# Patient Record
Sex: Female | Born: 1950 | Race: White | Hispanic: No | Smoking: Former smoker
Health system: Southern US, Community
[De-identification: ages and names within clinical notes are randomized; demographics above are authoritative.]

## PROBLEM LIST (undated history)

## (undated) DIAGNOSIS — I10 Essential (primary) hypertension: Secondary | ICD-10-CM

## (undated) DIAGNOSIS — K269 Duodenal ulcer, unspecified as acute or chronic, without hemorrhage or perforation: Secondary | ICD-10-CM

## (undated) DIAGNOSIS — C801 Malignant (primary) neoplasm, unspecified: Secondary | ICD-10-CM

## (undated) DIAGNOSIS — H269 Unspecified cataract: Secondary | ICD-10-CM

## (undated) DIAGNOSIS — J45909 Unspecified asthma, uncomplicated: Secondary | ICD-10-CM

## (undated) DIAGNOSIS — C4491 Basal cell carcinoma of skin, unspecified: Secondary | ICD-10-CM

## (undated) DIAGNOSIS — M1990 Unspecified osteoarthritis, unspecified site: Secondary | ICD-10-CM

## (undated) DIAGNOSIS — Z923 Personal history of irradiation: Secondary | ICD-10-CM

## (undated) DIAGNOSIS — K760 Fatty (change of) liver, not elsewhere classified: Secondary | ICD-10-CM

## (undated) DIAGNOSIS — A6009 Herpesviral infection of other urogenital tract: Secondary | ICD-10-CM

## (undated) DIAGNOSIS — D126 Benign neoplasm of colon, unspecified: Secondary | ICD-10-CM

## (undated) DIAGNOSIS — K5790 Diverticulosis of intestine, part unspecified, without perforation or abscess without bleeding: Secondary | ICD-10-CM

## (undated) DIAGNOSIS — T7840XA Allergy, unspecified, initial encounter: Secondary | ICD-10-CM

## (undated) DIAGNOSIS — Z8619 Personal history of other infectious and parasitic diseases: Secondary | ICD-10-CM

## (undated) DIAGNOSIS — G43909 Migraine, unspecified, not intractable, without status migrainosus: Secondary | ICD-10-CM

## (undated) DIAGNOSIS — E162 Hypoglycemia, unspecified: Secondary | ICD-10-CM

## (undated) DIAGNOSIS — J302 Other seasonal allergic rhinitis: Secondary | ICD-10-CM

## (undated) HISTORY — DX: Herpesviral infection of other urogenital tract: A60.09

## (undated) HISTORY — DX: Other seasonal allergic rhinitis: J30.2

## (undated) HISTORY — PX: COSMETIC SURGERY: SHX468

## (undated) HISTORY — DX: Personal history of other infectious and parasitic diseases: Z86.19

## (undated) HISTORY — DX: Unspecified asthma, uncomplicated: J45.909

## (undated) HISTORY — DX: Migraine, unspecified, not intractable, without status migrainosus: G43.909

## (undated) HISTORY — PX: ROOT CANAL: SHX2363

## (undated) HISTORY — DX: Hypoglycemia, unspecified: E16.2

## (undated) HISTORY — DX: Unspecified osteoarthritis, unspecified site: M19.90

## (undated) HISTORY — PX: POLYPECTOMY: SHX149

## (undated) HISTORY — PX: COLONOSCOPY: SHX174

## (undated) HISTORY — DX: Essential (primary) hypertension: I10

## (undated) HISTORY — DX: Unspecified cataract: H26.9

## (undated) HISTORY — DX: Fatty (change of) liver, not elsewhere classified: K76.0

## (undated) HISTORY — DX: Duodenal ulcer, unspecified as acute or chronic, without hemorrhage or perforation: K26.9

## (undated) HISTORY — DX: Malignant (primary) neoplasm, unspecified: C80.1

## (undated) HISTORY — DX: Basal cell carcinoma of skin, unspecified: C44.91

## (undated) HISTORY — PX: TOOTH EXTRACTION: SUR596

## (undated) HISTORY — PX: CATARACT EXTRACTION W/ INTRAOCULAR LENS  IMPLANT, BILATERAL: SHX1307

## (undated) HISTORY — DX: Allergy, unspecified, initial encounter: T78.40XA

---
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
## 1976-02-01 HISTORY — PX: TONSILLECTOMY: SUR1361

## 1999-02-01 HISTORY — PX: LIPOSUCTION: SHX10

## 2011-11-28 ENCOUNTER — Other Ambulatory Visit (HOSPITAL_COMMUNITY): Payer: Self-pay | Admitting: Internal Medicine

## 2011-11-28 DIAGNOSIS — Z1231 Encounter for screening mammogram for malignant neoplasm of breast: Secondary | ICD-10-CM

## 2011-12-13 ENCOUNTER — Ambulatory Visit (HOSPITAL_COMMUNITY)
Admission: RE | Admit: 2011-12-13 | Discharge: 2011-12-13 | Disposition: A | Discharge status: Home / Self Care | Payer: Self-pay | Source: Ambulatory Visit | Attending: Internal Medicine | Admitting: Internal Medicine

## 2011-12-13 DIAGNOSIS — Z1231 Encounter for screening mammogram for malignant neoplasm of breast: Secondary | ICD-10-CM

## 2013-11-20 ENCOUNTER — Ambulatory Visit (HOSPITAL_BASED_OUTPATIENT_CLINIC_OR_DEPARTMENT_OTHER)
Admission: RE | Admit: 2013-11-20 | Discharge: 2013-11-20 | Disposition: A | Discharge status: Home / Self Care | Payer: BC Managed Care – PPO | Source: Ambulatory Visit | Attending: Physician Assistant | Admitting: Physician Assistant

## 2013-11-20 ENCOUNTER — Ambulatory Visit (INDEPENDENT_AMBULATORY_CARE_PROVIDER_SITE_OTHER): Payer: BC Managed Care – PPO | Admitting: Physician Assistant

## 2013-11-20 ENCOUNTER — Ambulatory Visit (HOSPITAL_COMMUNITY): Payer: BC Managed Care – PPO

## 2013-11-20 ENCOUNTER — Encounter: Payer: Self-pay | Admitting: Physician Assistant

## 2013-11-20 VITALS — BP 102/40 | HR 78 | Temp 98.3°F | Resp 16 | Ht 63.5 in | Wt 210.0 lb

## 2013-11-20 DIAGNOSIS — Z8041 Family history of malignant neoplasm of ovary: Secondary | ICD-10-CM

## 2013-11-20 DIAGNOSIS — M2560 Stiffness of unspecified joint, not elsewhere classified: Secondary | ICD-10-CM

## 2013-11-20 DIAGNOSIS — J309 Allergic rhinitis, unspecified: Secondary | ICD-10-CM

## 2013-11-20 DIAGNOSIS — Z23 Encounter for immunization: Secondary | ICD-10-CM

## 2013-11-20 DIAGNOSIS — I10 Essential (primary) hypertension: Secondary | ICD-10-CM

## 2013-11-20 DIAGNOSIS — K219 Gastro-esophageal reflux disease without esophagitis: Secondary | ICD-10-CM

## 2013-11-20 DIAGNOSIS — R102 Pelvic and perineal pain: Secondary | ICD-10-CM

## 2013-11-20 NOTE — Patient Instructions (Addendum)
Please stop by lab for blood work. I will call you with your results. For now hold off on the hydrochlorothiazide.  Continue the other medications as directed.    Follow-up will be based on results.   It was a pleasure meeting you today!  Welcome to [facility]!

## 2013-11-20 NOTE — Progress Notes (Signed)
Patient presents to clinic today to establish care.  Acute Concerns: (1) Patient c/o of joint pain and inflammation in knees, neck and back over the past few years.  Endorses symptoms worsen with alcohol or red meat consumption.  Denies known history of gout. Is currently on HCTZ. Denies recent trauma.  Endorses prior history of osteoarthritis. Denies family rheumatoid arthritis. Endorses stiffness lasting more than 1 hour.  (2) Patient c/o dull and achy pain in left side and LLQ that has been present constantly for 1 year.  Denies fever, chills.  Denies change to appetite.  Endorses some constipation.  Denies blood in stool or tenesmus.  Denies nausea or vomiting.  Is overdue for colonoscopy.  Endorses acid reflux symptoms, occasionally.  Denies epigastric pain or dysphagia.  Chronic Issues: Hypertension -- Patient currently on combination of  Atenolol 25 mg QD, Lotensin 20 mg QD and HCTZ 12.5 mg QD.  BP 102/94.    Allergic Rhinitis -- seasonal.  Well-controlled at present with Flonase.  Health Maintenance: Dental -- Overdue. Vision -- Overdue.  Immunizations -- Due for influenza and TDaP.  Will be getting today. Colonoscopy -- Never had due to finances.  Will need referral to GI for screening colonoscopy.  Mammogram -- Endorses last mammogram in 2014.  Denies abnormal findings.  Past Medical History  Diagnosis Date  . Frequent headaches   . Migraine   . Duodenal ulcer   . Hay fever   . Seasonal allergies   . Environmental allergies   . Hypertension   . Arthritis   . History of chicken pox     Past Surgical History  Procedure Laterality Date  . Tonsillectomy  1978  . Tooth extraction    . Root canal      No current outpatient prescriptions on file prior to visit.   No current facility-administered medications on file prior to visit.    Allergies  Allergen Reactions  . Codeine Nausea And Vomiting  . Other Nausea And Vomiting    MSG    Family History  Problem  Relation Age of Onset  . Breast cancer Mother 109    Deceased  . Ovarian cancer Sister   . Stroke Mother   . Hypertension Mother   . Renal cancer Father   . Lung cancer Father 42    Deceased  . Heart disease Maternal Grandmother   . Stomach cancer Paternal Grandfather   . Cancer - Other Maternal Aunt     Pancreatic  . Diabetes Brother   . Heart disease Maternal Aunt   . Heart attack Maternal Aunt   . Melanoma Paternal Aunt   . Multiple myeloma Paternal Aunt   . Brain cancer Brother     G-Blastoma  . Melanoma Father   . Lung cancer Brother   . Lung cancer Maternal Aunt     History   Social History  . Marital Status: Single    Spouse Name: N/A    Number of Children: N/A  . Years of Education: N/A   Occupational History  . Not on file.   Social History Main Topics  . Smoking status: Former Research scientist (life sciences)  . Smokeless tobacco: Never Used     Comment: Quit >30 yrs  . Alcohol Use: Yes     Comment: social  . Drug Use: No  . Sexual Activity: Not on file   Other Topics Concern  . Not on file   Social History Narrative  . No narrative on file  ROS See HPI.  All other ROS are negative.  BP 102/40  Pulse 78  Temp(Src) 98.3 F (36.8 C) (Oral)  Resp 16  Ht 5' 3.5" (1.613 m)  Wt 210 lb (95.255 kg)  BMI 36.61 kg/m2  SpO2 97%  Physical Exam  Constitutional: She is oriented to person, place, and time and well-developed, well-nourished, and in no distress.  HENT:  Head: Normocephalic and atraumatic.  Right Ear: External ear normal.  Left Ear: External ear normal.  Nose: Nose normal.  Mouth/Throat: Oropharynx is clear and moist. No oropharyngeal exudate.  TM within normal limits bilaterally.  Eyes: Conjunctivae are normal. Pupils are equal, round, and reactive to light.  Neck: Neck supple. No thyromegaly present.  Cardiovascular: Normal rate, regular rhythm, normal heart sounds and intact distal pulses.   Pulmonary/Chest: Effort normal and breath sounds normal. No  respiratory distress. She has no wheezes. She has no rales. She exhibits no tenderness.  Abdominal: Soft. Bowel sounds are normal. She exhibits no distension and no mass. There is no tenderness. There is no rebound and no guarding.  Lymphadenopathy:    She has no cervical adenopathy.  Neurological: She is alert and oriented to person, place, and time.  Skin: Skin is warm and dry. No rash noted.  Psychiatric: Affect normal.   Assessment/Plan: Need for prophylactic vaccination and inoculation against influenza Immunization given by nursing staff.  Need for prophylactic vaccination with combined diphtheria-tetanus-pertussis (DTP) vaccine Immunization given by nursing staff.  Essential hypertension Medications refilled.  Take as directed.  DASH handout given.  Joint stiffness Will obtain CBC, ESR, RF and Uric Acid level.  Pelvic pain in female + Family history of Ovarian Cancer.  Will obtain pelvic and Transvaginal US.  CBC, BMP, UA and culture will also be obtained.

## 2013-11-20 NOTE — Progress Notes (Signed)
Pre visit review using our clinic review tool, if applicable. No additional management support is needed unless otherwise documented below in the visit note/SLS  

## 2013-11-21 LAB — URINALYSIS, ROUTINE W REFLEX MICROSCOPIC
Bilirubin Urine: NEGATIVE
Hgb urine dipstick: NEGATIVE
KETONES UR: NEGATIVE
Leukocytes, UA: NEGATIVE
Nitrite: NEGATIVE
PH: 6 (ref 5.0–8.0)
Specific Gravity, Urine: 1.025 (ref 1.000–1.030)
TOTAL PROTEIN, URINE-UPE24: NEGATIVE
URINE GLUCOSE: NEGATIVE
Urobilinogen, UA: 0.2 (ref 0.0–1.0)

## 2013-11-21 LAB — RHEUMATOID FACTOR: Rheumatoid fact SerPl-aCnc: <10 [IU]/mL

## 2013-11-21 LAB — BASIC METABOLIC PANEL
BUN: 17 mg/dL (ref 6–23)
CO2: 28 mEq/L (ref 19–32)
Calcium: 9.4 mg/dL (ref 8.4–10.5)
Chloride: 102 mEq/L (ref 96–112)
Creatinine, Ser: 0.9 mg/dL (ref 0.4–1.2)
GFR: 66.54 mL/min (ref >60.00)
GLUCOSE: 116 mg/dL — AB (ref 70–99)
Potassium: 3.3 mEq/L — ABNORMAL LOW (ref 3.5–5.1)
Sodium: 138 mEq/L (ref 135–145)

## 2013-11-21 LAB — CBC
HCT: 39.7 % (ref 36.0–46.0)
Hemoglobin: 13.3 g/dL (ref 12.0–15.0)
MCHC: 33.6 g/dL (ref 30.0–36.0)
MCV: 93.1 fl (ref 78.0–100.0)
PLATELETS: 277 10*3/uL (ref 150.0–400.0)
RBC: 4.26 Mil/uL (ref 3.87–5.11)
RDW: 13.2 % (ref 11.5–15.5)
WBC: 7.3 10*3/uL (ref 4.0–10.5)

## 2013-11-21 LAB — URIC ACID: Uric Acid, Serum: 6.4 mg/dL (ref 2.4–7.0)

## 2013-11-21 LAB — SEDIMENTATION RATE: Sed Rate: 7 mm/hr (ref 0–22)

## 2013-11-21 LAB — H. PYLORI ANTIBODY, IGG: H Pylori IgG: NEGATIVE

## 2013-11-22 ENCOUNTER — Telehealth: Payer: Self-pay

## 2013-11-22 DIAGNOSIS — K219 Gastro-esophageal reflux disease without esophagitis: Secondary | ICD-10-CM | POA: Insufficient documentation

## 2013-11-22 DIAGNOSIS — R102 Pelvic and perineal pain unspecified side: Secondary | ICD-10-CM | POA: Insufficient documentation

## 2013-11-22 DIAGNOSIS — J309 Allergic rhinitis, unspecified: Secondary | ICD-10-CM | POA: Insufficient documentation

## 2013-11-22 DIAGNOSIS — Z8041 Family history of malignant neoplasm of ovary: Secondary | ICD-10-CM | POA: Insufficient documentation

## 2013-11-22 DIAGNOSIS — M2560 Stiffness of unspecified joint, not elsewhere classified: Secondary | ICD-10-CM | POA: Insufficient documentation

## 2013-11-22 DIAGNOSIS — I10 Essential (primary) hypertension: Secondary | ICD-10-CM | POA: Insufficient documentation

## 2013-11-22 DIAGNOSIS — Z23 Encounter for immunization: Secondary | ICD-10-CM

## 2013-11-22 NOTE — Telephone Encounter (Signed)
Pt notified and made aware.  Follow up appointment scheduled for Friday, November 13th at 1:15 pm with Elyn Aquas, PA-C.

## 2013-11-22 NOTE — Telephone Encounter (Signed)
Message copied by Rudene Anda on Fri Nov 22, 2013  3:59 PM ------      Message from: Raiford Noble      Created: Fri Nov 22, 2013 12:27 PM       Labs look great overall. Potassium mildly decreased but should improve now that we have stopped HCTZ.  Uric acid is normal as well as rheumatoid panel.  Do not think symptoms are gout related.  Still want her to hold off on HCTZ until I see her for follow-up in a few weeks. ------

## 2013-11-26 DIAGNOSIS — Z23 Encounter for immunization: Secondary | ICD-10-CM | POA: Insufficient documentation

## 2013-11-26 NOTE — Assessment & Plan Note (Signed)
Immunization given by nursing staff. 

## 2013-11-26 NOTE — Assessment & Plan Note (Signed)
Medications refilled.  Take as directed.  DASH handout given. 

## 2013-11-26 NOTE — Assessment & Plan Note (Signed)
+   Family history of Ovarian Cancer.  Will obtain pelvic and Transvaginal US.  CBC, BMP, UA and culture will also be obtained.

## 2013-11-26 NOTE — Assessment & Plan Note (Signed)
Will obtain CBC, ESR, RF and Uric Acid level. 

## 2013-12-13 ENCOUNTER — Encounter: Payer: Self-pay | Admitting: Physician Assistant

## 2013-12-13 ENCOUNTER — Ambulatory Visit (INDEPENDENT_AMBULATORY_CARE_PROVIDER_SITE_OTHER): Payer: BC Managed Care – PPO | Admitting: Physician Assistant

## 2013-12-13 VITALS — BP 107/86 | HR 66 | Temp 98.6°F | Resp 16 | Ht 63.5 in | Wt 207.5 lb

## 2013-12-13 DIAGNOSIS — M62838 Other muscle spasm: Secondary | ICD-10-CM | POA: Insufficient documentation

## 2013-12-13 DIAGNOSIS — H269 Unspecified cataract: Secondary | ICD-10-CM

## 2013-12-13 DIAGNOSIS — I10 Essential (primary) hypertension: Secondary | ICD-10-CM

## 2013-12-13 DIAGNOSIS — Z1211 Encounter for screening for malignant neoplasm of colon: Secondary | ICD-10-CM

## 2013-12-13 DIAGNOSIS — E876 Hypokalemia: Secondary | ICD-10-CM | POA: Insufficient documentation

## 2013-12-13 LAB — BASIC METABOLIC PANEL
BUN: 13 mg/dL (ref 6–23)
CO2: 28 mEq/L (ref 19–32)
CREATININE: 0.9 mg/dL (ref 0.4–1.2)
Calcium: 9.5 mg/dL (ref 8.4–10.5)
Chloride: 104 mEq/L (ref 96–112)
GFR: 67.38 mL/min (ref >60.00)
Glucose, Bld: 103 mg/dL — ABNORMAL HIGH (ref 70–99)
Potassium: 3.9 mEq/L (ref 3.5–5.1)
Sodium: 138 mEq/L (ref 135–145)

## 2013-12-13 MED ORDER — METHOCARBAMOL 500 MG PO TABS: 500.0000 mg | ORAL_TABLET | Freq: Three times a day (TID) | ORAL | Status: DC | PRN

## 2013-12-13 NOTE — Progress Notes (Signed)
Pre visit review using our clinic review tool, if applicable. No additional management support is needed unless otherwise documented below in the visit note/SLS  

## 2013-12-13 NOTE — Assessment & Plan Note (Signed)
Encouraged Glucosamine-Chondroitin supplementation.  Rx Robaxin for spasm. Rechecking BMP (Potassium) level today.  Follow-up in 1 month.

## 2013-12-13 NOTE — Patient Instructions (Signed)
We will stop the HCTZ for now.  Continue other BP medications.  Stay well hydrated.  Start a Glucosamine-Chondroitin supplementation to help with joints.  Take Robaxin as directed.  Follow-up in 1-2 months.  Hypertension Hypertension, commonly called high blood pressure, is when the force of blood pumping through your arteries is too strong. Your arteries are the blood vessels that carry blood from your heart throughout your body. A blood pressure reading consists of a higher number over a lower number, such as 110/72. The higher number (systolic) is the pressure inside your arteries when your heart pumps. The lower number (diastolic) is the pressure inside your arteries when your heart relaxes. Ideally you want your blood pressure below 120/80. Hypertension forces your heart to work harder to pump blood. Your arteries may become narrow or stiff. Having hypertension puts you at risk for heart disease, stroke, and other problems.  RISK FACTORS Some risk factors for high blood pressure are controllable. Others are not.  Risk factors you cannot control include:   Race. You may be at higher risk if you are African American.  Age. Risk increases with age.  Gender. Men are at higher risk than women before age 37 years. After age 24, women are at higher risk than men. Risk factors you can control include:  Not getting enough exercise or physical activity.  Being overweight.  Getting too much fat, sugar, calories, or salt in your diet.  Drinking too much alcohol. SIGNS AND SYMPTOMS Hypertension does not usually cause signs or symptoms. Extremely high blood pressure (hypertensive crisis) may cause headache, anxiety, shortness of breath, and nosebleed. DIAGNOSIS  To check if you have hypertension, your health care provider will measure your blood pressure while you are seated, with your arm held at the level of your heart. It should be measured at least twice using the same arm. Certain conditions  can cause a difference in blood pressure between your right and left arms. A blood pressure reading that is higher than normal on one occasion does not mean that you need treatment. If one blood pressure reading is high, ask your health care provider about having it checked again. TREATMENT  Treating high blood pressure includes making lifestyle changes and possibly taking medicine. Living a healthy lifestyle can help lower high blood pressure. You may need to change some of your habits. Lifestyle changes may include:  Following the DASH diet. This diet is high in fruits, vegetables, and whole grains. It is low in salt, red meat, and added sugars.  Getting at least 2 hours of brisk physical activity every week.  Losing weight if necessary.  Not smoking.  Limiting alcoholic beverages.  Learning ways to reduce stress. If lifestyle changes are not enough to get your blood pressure under control, your health care provider may prescribe medicine. You may need to take more than one. Work closely with your health care provider to understand the risks and benefits. HOME CARE INSTRUCTIONS  Have your blood pressure rechecked as directed by your health care provider.   Take medicines only as directed by your health care provider. Follow the directions carefully. Blood pressure medicines must be taken as prescribed. The medicine does not work as well when you skip doses. Skipping doses also puts you at risk for problems.   Do not smoke.   Monitor your blood pressure at home as directed by your health care provider. SEEK MEDICAL CARE IF:   You think you are having a reaction to medicines  taken.  You have recurrent headaches or feel dizzy.  You have swelling in your ankles.  You have trouble with your vision. SEEK IMMEDIATE MEDICAL CARE IF:  You develop a severe headache or confusion.  You have unusual weakness, numbness, or feel faint.  You have severe chest or abdominal  pain.  You vomit repeatedly.  You have trouble breathing. MAKE SURE YOU:   Understand these instructions.  Will watch your condition.  Will get help right away if you are not doing well or get worse. Document Released: 01/17/2005 Document Revised: 06/03/2013 Document Reviewed: 11/09/2012 [facility] Patient Information 2015 [place], [place]. This information is not intended to replace advice given to you by your health care provider. Make sure you discuss any questions you have with your health care provider.

## 2013-12-13 NOTE — Assessment & Plan Note (Signed)
BP looks good.  Continue DASH diet. Will remain off of the HCTZ.  Follow-up in 1-2 months for reassessment.

## 2013-12-13 NOTE — Assessment & Plan Note (Signed)
Referral placed to GI for screening colonoscopy 

## 2013-12-13 NOTE — Assessment & Plan Note (Signed)
Will recheck BMP today now that patient is off of diuretic.

## 2013-12-13 NOTE — Progress Notes (Signed)
Patient presents to clinic today for follow-up and BP recheck after holding her HCTZ.  BP 107/68 in clinic today. Patient endorses marked improvement in leg pain and spasms.  Patient denies chest pain, palpitations, lightheadedness, vision changes or frequent headaches.  Patient is requesting referral to Ophthalmology for evaluation of her cataracts.  Also ready to schedule her screening colonoscopy.  Past Medical History  Diagnosis Date  . Frequent headaches   . Migraine   . Duodenal ulcer   . Hay fever   . Seasonal allergies   . Environmental allergies   . Hypertension   . Arthritis   . History of chicken pox     Current Outpatient Prescriptions on File Prior to Visit  Medication Sig Dispense Refill  . albuterol (PROVENTIL HFA;VENTOLIN HFA) 108 (90 BASE) MCG/ACT inhaler Inhale 1-2 puffs into the lungs every 6 (six) hours as needed for wheezing or shortness of breath.    Marland Kitchen atenolol (TENORMIN) 25 MG tablet Take 25 mg by mouth daily.    . benazepril (LOTENSIN) 20 MG tablet Take 20 mg by mouth daily.    . fluticasone (FLONASE) 50 MCG/ACT nasal spray Place 2 sprays into both nostrils daily as needed for allergies or rhinitis.    Marland Kitchen ibuprofen (ADVIL,MOTRIN) 200 MG tablet Take 200 mg by mouth every 6 (six) hours as needed for headache or moderate pain.     No current facility-administered medications on file prior to visit.    Allergies  Allergen Reactions  . Codeine Nausea And Vomiting  . Other Diarrhea and Nausea And Vomiting    MSG    Family History  Problem Relation Age of Onset  . Breast cancer Mother 75    Deceased  . Ovarian cancer Sister   . Stroke Mother   . Hypertension Mother   . Renal cancer Father   . Lung cancer Father 30    Deceased  . Heart disease Maternal Grandmother   . Stomach cancer Paternal Grandfather   . Cancer - Other Maternal Aunt     Pancreatic  . Diabetes Brother   . Heart disease Maternal Aunt   . Heart attack Maternal Aunt   . Melanoma  Paternal Aunt   . Multiple myeloma Paternal Aunt   . Brain cancer Brother     G-Blastoma  . Melanoma Father   . Lung cancer Brother   . Lung cancer Maternal Aunt     History   Social History  . Marital Status: Single    Spouse Name: N/A    Number of Children: N/A  . Years of Education: N/A   Social History Main Topics  . Smoking status: Former Research scientist (life sciences)  . Smokeless tobacco: Never Used     Comment: Quit >30 yrs  . Alcohol Use: Yes     Comment: social  . Drug Use: No  . Sexual Activity: None   Other Topics Concern  . None   Social History Narrative   Review of Systems - See HPI.  All other ROS are negative.  BP 107/86 mmHg  Pulse 66  Temp(Src) 98.6 F (37 C) (Oral)  Resp 16  Ht 5' 3.5" (1.613 m)  Wt 207 lb 8 oz (94.121 kg)  BMI 36.18 kg/m2  SpO2 99%  Physical Exam  Constitutional: She is oriented to person, place, and time and well-developed, well-nourished, and in no distress.  HENT:  Head: Normocephalic and atraumatic.  Eyes: Conjunctivae are normal.  Cardiovascular: Normal rate, regular rhythm, normal heart sounds and  intact distal pulses.   Pulmonary/Chest: Effort normal and breath sounds normal. No respiratory distress. She has no wheezes. She has no rales. She exhibits no tenderness.  Neurological: She is alert and oriented to person, place, and time.  Skin: Skin is warm and dry. No rash noted.  Psychiatric: Affect normal.  Vitals reviewed.   Recent Results (from the past 2160 hour(s))  Uric acid     Status: None   Collection Time: 11/20/13  3:57 PM  Result Value Ref Range   Uric Acid, Serum 6.4 2.4 - 7.0 mg/dL  Rheumatoid Factor     Status: None   Collection Time: 11/20/13  3:57 PM  Result Value Ref Range   Rhuematoid fact SerPl-aCnc <10 <=14 IU/mL    Comment:                            Interpretive Table                     Low Positive: 15 - 41 IU/mL                     High Positive:  >= 42 IU/mL    In addition to the RF result, and clinical  symptoms including joint  involvement, the 2010 ACR Classification Criteria for  scoring/diagnosing Rheumatoid Arthritis include the results of the  following tests:  CRP (81191), ESR (15010), and CCP (APCA) (47829).  [email or web address].org/practice/clinical/classification/ra/ra_2010.asp  Sed Rate (ESR)     Status: None   Collection Time: 11/20/13  3:57 PM  Result Value Ref Range   Sed Rate 7 0 - 22 mm/hr  CBC     Status: None   Collection Time: 11/20/13  3:57 PM  Result Value Ref Range   WBC 7.3 4.0 - 10.5 K/uL   RBC 4.26 3.87 - 5.11 Mil/uL   Platelets 277.0 150.0 - 400.0 K/uL   Hemoglobin 13.3 12.0 - 15.0 g/dL   HCT 39.7 36.0 - 46.0 %   MCV 93.1 78.0 - 100.0 fl   MCHC 33.6 30.0 - 36.0 g/dL   RDW 13.2 11.5 - 56.2 %  Basic Metabolic Panel (BMET)     Status: Abnormal   Collection Time: 11/20/13  3:57 PM  Result Value Ref Range   Sodium 138 135 - 145 mEq/L   Potassium 3.3 (L) 3.5 - 5.1 mEq/L   Chloride 102 96 - 112 mEq/L   CO2 28 19 - 32 mEq/L   Glucose, Bld 116 (H) 70 - 99 mg/dL   BUN 17 6 - 23 mg/dL   Creatinine, Ser 0.9 0.4 - 1.2 mg/dL   Calcium 9.4 8.4 - 10.5 mg/dL   GFR 66.54 >60.00 mL/min  H. pylori antibody, IgG     Status: None   Collection Time: 11/20/13  3:57 PM  Result Value Ref Range   H Pylori IgG Negative Negative  Urinalysis, Routine w reflex microscopic     Status: None   Collection Time: 11/20/13  3:57 PM  Result Value Ref Range   Color, Urine YELLOW Yellow;Lt. Yellow   APPearance CLEAR Clear   Specific Gravity, Urine 1.025 1.000-1.030   pH 6.0 5.0 - 8.0   Total Protein, Urine NEGATIVE Negative   Urine Glucose NEGATIVE Negative   Ketones, ur NEGATIVE Negative   Bilirubin Urine NEGATIVE Negative   Hgb urine dipstick NEGATIVE Negative   Urobilinogen, UA 0.2 0.0 - 1.0   Leukocytes, UA NEGATIVE  Negative   Nitrite NEGATIVE Negative   WBC, UA 0-2/hpf 0-2/hpf   RBC / HPF 0-2/hpf 0-2/hpf    Assessment/Plan: Hypokalemia Will recheck BMP today now that  patient is off of diuretic.  Muscle spasms of both lower extremities Encouraged Glucosamine-Chondroitin supplementation.  Rx Robaxin for spasm. Rechecking BMP (Potassium) level today.  Follow-up in 1 month.  Colon cancer screening Referral placed to GI for screening colonoscopy.  Essential hypertension BP looks good.  Continue DASH diet. Will remain off of the HCTZ.  Follow-up in 1-2 months for reassessment.

## 2013-12-17 ENCOUNTER — Ambulatory Visit: Payer: Self-pay | Admitting: Family Medicine

## 2013-12-25 ENCOUNTER — Encounter: Payer: Self-pay | Admitting: Gastroenterology

## 2014-01-07 ENCOUNTER — Ambulatory Visit: Payer: Self-pay | Admitting: Family Medicine

## 2014-01-13 ENCOUNTER — Encounter: Payer: Self-pay | Admitting: Physician Assistant

## 2014-01-13 ENCOUNTER — Ambulatory Visit (INDEPENDENT_AMBULATORY_CARE_PROVIDER_SITE_OTHER): Payer: BC Managed Care – PPO | Admitting: Physician Assistant

## 2014-01-13 VITALS — BP 98/54 | HR 62 | Temp 98.0°F | Resp 16 | Ht 63.5 in | Wt 209.1 lb

## 2014-01-13 DIAGNOSIS — I10 Essential (primary) hypertension: Secondary | ICD-10-CM

## 2014-01-13 DIAGNOSIS — M62838 Other muscle spasm: Secondary | ICD-10-CM

## 2014-01-13 MED ORDER — CARISOPRODOL 350 MG PO TABS: ORAL_TABLET | ORAL | Status: DC

## 2014-01-13 NOTE — Assessment & Plan Note (Signed)
Will begin Soma daily for 3 days then increases to twice daily.  Continue supplementation.  Referral to Orthopedics placed as workup here has been unremarkable and patient with varying MSK complaints.

## 2014-01-13 NOTE — Progress Notes (Signed)
Pre visit review using our clinic review tool, if applicable. No additional management support is needed unless otherwise documented below in the visit note/SLS  

## 2014-01-13 NOTE — Patient Instructions (Signed)
Please start taking 1/2 tablet of the Benzepril daily.  Continue the Atenolol as directed.  For the muscle spasms -- Please start the Soma daily for 3 days, then increase to twice daily.  Continue the Glucosamine-Chondroitin supplement as directed.  You will be contacted by Orthopedics for further assessment.   Fax number here is [phone].  Follow-up for a BP recheck with the nurse in 1 month.

## 2014-01-13 NOTE — Progress Notes (Signed)
Patient presents to clinic today for follow-up of hypertension and idiopathic muscle spasms of legs bilaterally.  For the hypertension, patient still on low end of normotensive, despite removing her HCTZ.  BP at 98/54.  Asymptomatic. Patient currently on combination of Benazepril 20 mg daily and Atenolol 25 mg QHS.  Patient still having intermittent muscle spasms, most days of the week.  Robaxin is not helpful.   Past Medical History  Diagnosis Date  . Frequent headaches   . Migraine   . Duodenal ulcer   . Hay fever   . Seasonal allergies   . Environmental allergies   . Hypertension   . Arthritis   . History of chicken pox     Current Outpatient Prescriptions on File Prior to Visit  Medication Sig Dispense Refill  . albuterol (PROVENTIL HFA;VENTOLIN HFA) 108 (90 BASE) MCG/ACT inhaler Inhale 1-2 puffs into the lungs every 6 (six) hours as needed for wheezing or shortness of breath.    Marland Kitchen atenolol (TENORMIN) 25 MG tablet Take 25 mg by mouth daily.    . benazepril (LOTENSIN) 20 MG tablet Take 20 mg by mouth daily.    . fluticasone (FLONASE) 50 MCG/ACT nasal spray Place 2 sprays into both nostrils daily as needed for allergies or rhinitis.    Marland Kitchen ibuprofen (ADVIL,MOTRIN) 200 MG tablet Take 200 mg by mouth every 6 (six) hours as needed for headache or moderate pain.     No current facility-administered medications on file prior to visit.    Allergies  Allergen Reactions  . Codeine Nausea And Vomiting  . Other Diarrhea and Nausea And Vomiting    MSG    Family History  Problem Relation Age of Onset  . Breast cancer Mother 57    Deceased  . Ovarian cancer Sister   . Stroke Mother   . Hypertension Mother   . Renal cancer Father   . Lung cancer Father 29    Deceased  . Heart disease Maternal Grandmother   . Stomach cancer Paternal Grandfather   . Cancer - Other Maternal Aunt     Pancreatic  . Diabetes Brother   . Heart disease Maternal Aunt   . Heart attack Maternal Aunt   .  Melanoma Paternal Aunt   . Multiple myeloma Paternal Aunt   . Brain cancer Brother     G-Blastoma  . Melanoma Father   . Lung cancer Brother   . Lung cancer Maternal Aunt     History   Social History  . Marital Status: Single    Spouse Name: N/A    Number of Children: N/A  . Years of Education: N/A   Social History Main Topics  . Smoking status: Former Research scientist (life sciences)  . Smokeless tobacco: Never Used     Comment: Quit >30 yrs  . Alcohol Use: Yes     Comment: social  . Drug Use: No  . Sexual Activity: None   Other Topics Concern  . None   Social History Narrative   Review of Systems - See HPI.  All other ROS are negative.  BP 98/54 mmHg  Pulse 62  Temp(Src) 98 F (36.7 C) (Oral)  Resp 16  Ht 5' 3.5" (1.613 m)  Wt 209 lb 2 oz (94.858 kg)  BMI 36.46 kg/m2  SpO2 100%  Physical Exam  Constitutional: She is oriented to person, place, and time and well-developed, well-nourished, and in no distress.  HENT:  Head: Normocephalic and atraumatic.  Eyes: Conjunctivae are normal.  Cardiovascular:  Normal rate, regular rhythm, normal heart sounds and intact distal pulses.   Pulmonary/Chest: Effort normal and breath sounds normal. No respiratory distress. She has no wheezes. She has no rales. She exhibits no tenderness.  Neurological: She is alert and oriented to person, place, and time.  Skin: Skin is warm and dry. No rash noted.  Psychiatric: Affect normal.  Vitals reviewed.   Recent Results (from the past 2160 hour(s))  Uric acid     Status: None   Collection Time: 11/20/13  3:57 PM  Result Value Ref Range   Uric Acid, Serum 6.4 2.4 - 7.0 mg/dL  Rheumatoid Factor     Status: None   Collection Time: 11/20/13  3:57 PM  Result Value Ref Range   Rhuematoid fact SerPl-aCnc <10 <=14 IU/mL    Comment:                            Interpretive Table                     Low Positive: 15 - 41 IU/mL                     High Positive:  >= 42 IU/mL    In addition to the RF result, and  clinical symptoms including joint  involvement, the 2010 ACR Classification Criteria for  scoring/diagnosing Rheumatoid Arthritis include the results of the  following tests:  CRP (78295), ESR (15010), and CCP (APCA) (62130).  www.rheumatology.org/practice/clinical/classification/ra/ra_2010.asp  Sed Rate (ESR)     Status: None   Collection Time: 11/20/13  3:57 PM  Result Value Ref Range   Sed Rate 7 0 - 22 mm/hr  CBC     Status: None   Collection Time: 11/20/13  3:57 PM  Result Value Ref Range   WBC 7.3 4.0 - 10.5 K/uL   RBC 4.26 3.87 - 5.11 Mil/uL   Platelets 277.0 150.0 - 400.0 K/uL   Hemoglobin 13.3 12.0 - 15.0 g/dL   HCT 39.7 36.0 - 46.0 %   MCV 93.1 78.0 - 100.0 fl   MCHC 33.6 30.0 - 36.0 g/dL   RDW 13.2 11.5 - 86.5 %  Basic Metabolic Panel (BMET)     Status: Abnormal   Collection Time: 11/20/13  3:57 PM  Result Value Ref Range   Sodium 138 135 - 145 mEq/L   Potassium 3.3 (L) 3.5 - 5.1 mEq/L   Chloride 102 96 - 112 mEq/L   CO2 28 19 - 32 mEq/L   Glucose, Bld 116 (H) 70 - 99 mg/dL   BUN 17 6 - 23 mg/dL   Creatinine, Ser 0.9 0.4 - 1.2 mg/dL   Calcium 9.4 8.4 - 10.5 mg/dL   GFR 66.54 >60.00 mL/min  H. pylori antibody, IgG     Status: None   Collection Time: 11/20/13  3:57 PM  Result Value Ref Range   H Pylori IgG Negative Negative  Urinalysis, Routine w reflex microscopic     Status: None   Collection Time: 11/20/13  3:57 PM  Result Value Ref Range   Color, Urine YELLOW Yellow;Lt. Yellow   APPearance CLEAR Clear   Specific Gravity, Urine 1.025 1.000-1.030   pH 6.0 5.0 - 8.0   Total Protein, Urine NEGATIVE Negative   Urine Glucose NEGATIVE Negative   Ketones, ur NEGATIVE Negative   Bilirubin Urine NEGATIVE Negative   Hgb urine dipstick NEGATIVE Negative   Urobilinogen, UA 0.2  0.0 - 1.0   Leukocytes, UA NEGATIVE Negative   Nitrite NEGATIVE Negative   WBC, UA 0-2/hpf 0-2/hpf   RBC / HPF 0-2/hpf 2-2/LNL  Basic Metabolic Panel (BMET)     Status: Abnormal    Collection Time: 12/13/13  2:04 PM  Result Value Ref Range   Sodium 138 135 - 145 mEq/L   Potassium 3.9 3.5 - 5.1 mEq/L   Chloride 104 96 - 112 mEq/L   CO2 28 19 - 32 mEq/L   Glucose, Bld 103 (H) 70 - 99 mg/dL   BUN 13 6 - 23 mg/dL   Creatinine, Ser 0.9 0.4 - 1.2 mg/dL   Calcium 9.5 8.4 - 10.5 mg/dL   GFR 67.38 >60.00 mL/min    Assessment/Plan: Muscle spasms of both lower extremities Will begin Soma daily for 3 days then increases to twice daily.  Continue supplementation.  Referral to Orthopedics placed as workup here has been unremarkable and patient with varying MSK complaints.  Essential hypertension Decreased Benazepril to 10 mg daily. Continue Atenolol daily. Follow-up in 1 month.

## 2014-01-13 NOTE — Assessment & Plan Note (Signed)
Decreased Benazepril to 10 mg daily. Continue Atenolol daily. Follow-up in 1 month.

## 2014-01-31 DIAGNOSIS — D126 Benign neoplasm of colon, unspecified: Secondary | ICD-10-CM

## 2014-01-31 HISTORY — DX: Benign neoplasm of colon, unspecified: D12.6

## 2014-02-05 ENCOUNTER — Other Ambulatory Visit: Payer: Self-pay | Admitting: *Deleted

## 2014-02-05 DIAGNOSIS — M62838 Other muscle spasm: Secondary | ICD-10-CM

## 2014-02-05 NOTE — Telephone Encounter (Signed)
Medication Detail      Disp Refills Start End     carisoprodol (SOMA) 350 MG tablet 60 tablet 0 01/13/2014     Sig: Take 1 tablet daily for 3 days. Then increase to 1 tablet twice daily.    Class: Print    HOLD Rx until due for refill/SLS

## 2014-02-07 ENCOUNTER — Other Ambulatory Visit: Payer: Self-pay | Admitting: *Deleted

## 2014-02-07 DIAGNOSIS — M62838 Other muscle spasm: Secondary | ICD-10-CM

## 2014-02-07 NOTE — Telephone Encounter (Signed)
Medication Detail      Disp Refills Start End     carisoprodol (SOMA) 350 MG tablet 60 tablet 0 01/13/2014     Sig: Take 1 tablet daily for 3 days. Then increase to 1 tablet twice daily.    Class: Print     Associated Diagnoses    Muscle spasms of both lower extremities - Primary       Pharmacy    [facility] # [place], [place] Until due for refill/SLS

## 2014-02-10 MED ORDER — CARISOPRODOL 350 MG PO TABS: ORAL_TABLET | ORAL | Status: DC

## 2014-03-07 ENCOUNTER — Ambulatory Visit (AMBULATORY_SURGERY_CENTER): Payer: Self-pay | Admitting: *Deleted

## 2014-03-07 VITALS — Ht 64.0 in | Wt 213.0 lb

## 2014-03-07 DIAGNOSIS — Z1211 Encounter for screening for malignant neoplasm of colon: Secondary | ICD-10-CM

## 2014-03-07 MED ORDER — MOVIPREP 100 G PO SOLR: 1.0000 | Freq: Once | ORAL | Status: DC

## 2014-03-07 NOTE — Progress Notes (Signed)
No egg or soy allergy. No anesthesia problems.  No home O2.  No diet meds.  

## 2014-03-21 ENCOUNTER — Ambulatory Visit (AMBULATORY_SURGERY_CENTER): Payer: BLUE CROSS/BLUE SHIELD | Admitting: Gastroenterology

## 2014-03-21 ENCOUNTER — Encounter: Payer: Self-pay | Admitting: Gastroenterology

## 2014-03-21 VITALS — BP 128/76 | HR 66 | Temp 97.5°F | Resp 20 | Ht 64.0 in | Wt 213.0 lb

## 2014-03-21 DIAGNOSIS — K621 Rectal polyp: Secondary | ICD-10-CM

## 2014-03-21 DIAGNOSIS — D125 Benign neoplasm of sigmoid colon: Secondary | ICD-10-CM

## 2014-03-21 DIAGNOSIS — D128 Benign neoplasm of rectum: Secondary | ICD-10-CM

## 2014-03-21 DIAGNOSIS — D123 Benign neoplasm of transverse colon: Secondary | ICD-10-CM

## 2014-03-21 DIAGNOSIS — Z1211 Encounter for screening for malignant neoplasm of colon: Secondary | ICD-10-CM

## 2014-03-21 DIAGNOSIS — D129 Benign neoplasm of anus and anal canal: Secondary | ICD-10-CM

## 2014-03-21 DIAGNOSIS — D122 Benign neoplasm of ascending colon: Secondary | ICD-10-CM

## 2014-03-21 MED ORDER — SODIUM CHLORIDE 0.9 % IV SOLN
500.0000 mL | INTRAVENOUS | Status: DC
Start: 2014-03-21 — End: 2014-03-21

## 2014-03-21 NOTE — Progress Notes (Signed)
Stable to RR 

## 2014-03-21 NOTE — Progress Notes (Signed)
Called to room to assist during endoscopic procedure.  Patient ID and intended procedure confirmed with present staff. Received instructions for my participation in the procedure from the performing physician.  

## 2014-03-21 NOTE — Patient Instructions (Signed)

## 2014-03-21 NOTE — Op Note (Addendum)
[place]  [address] [place], [id number]   COLONOSCOPY PROCEDURE REPORT  PATIENT: Briana Hull, Briana Hull  MR#: [id number] BIRTHDATE: 04/14/1950 , 60  yrs. old GENDER: female ENDOSCOPIST: Ladene Artist, MD, [facility] REFERRED BY: Raiford Noble, PA-C PROCEDURE DATE:  03/21/2014 PROCEDURE:   Colonoscopy with biopsy and Colonoscopy with snare polypectomy First Screening Colonoscopy - Avg.  risk and is 50 yrs.  old or older Yes.  Prior Negative Screening - Now for repeat screening. N/A  History of Adenoma - Now for follow-up colonoscopy & has been > or = to 3 yrs.  N/A  Polyps Removed Today? Yes. ASA CLASS:   Class II INDICATIONS:average risk patient for colorectal cancer. MEDICATIONS: Monitored anesthesia care, Propofol 400 mg IV, and lidocaine 40 mg IV DESCRIPTION OF PROCEDURE:   After the risks benefits and alternatives of the procedure were thoroughly explained, informed consent was obtained.  The digital rectal exam revealed no abnormalities of the rectum.   The LB [id number]  endoscope was introduced through the anus and advanced to the cecum, which was identified by both the appendix and ileocecal valve. No adverse events experienced.   The quality of the prep was good, using MoviPrep  The instrument was then slowly withdrawn as the colon was fully examined.    COLON FINDINGS: Two sessile polyps measuring 6 mm in size were found in the ascending colon.  Polypectomies were performed with a cold snare.  The resection was complete, the polyp tissue was completely retrieved and sent to histology.   A sessile polyp measuring 4 mm in size was found in the transverse colon.  A polypectomy was performed with cold forceps.  The resection was complete, the polyp tissue was completely retrieved and sent to histology.   Eight semi-pedunculated polyps measuring 8-10 mm in size were found in the sigmoid colon.  Polypectomies were performed using snare cautery.  The  resection was complete, the polyp tissue was completely retrieved and sent to histology. Five sessile polyps measuring 6-7 mm in size were found in the rectume.  Polypectomies were performed using snare cautery and with a cold snare.  The resection was complete, the polyp tissue was completely retrieved and sent to histology.   There was mild diverticulosis noted in the sigmoid colon.   The examination was otherwise normal.  Retroflexed views revealed no abnormalities. The time to cecum=2 minutes 11 seconds.  Withdrawal time=19 minutes 25 seconds.  The scope was withdrawn and the procedure completed.  COMPLICATIONS: There were no immediate complications.   ENDOSCOPIC IMPRESSION: 1.   Two sessile polyps in the ascending colon; polypectomies performed with a cold snare 2.   Sessile polyp in the transverse colon; polypectomy performed with cold forceps 3.   Eight semi-pedunculated polyps in the sigmoid colon; polypectomies performed using snare cautery 4.   Five sessile polyps in the rectum; polypectomies were performed using snare cautery and with a cold snare 5.   Mild diverticulosis in the sigmoid colon  RECOMMENDATIONS: 1.  Hold Aspirin and all other NSAIDS for 3 weeks. 2.  Await pathology results 3.  Repeat colonoscopy in 1-3 years pending pathology review. May need genetic testing as well.  eSigned:  Ladene Artist, MD, [facility] 03/21/2014 9:11 AM Revised: 03/21/2014 9:11 AM     PATIENT NAME:  Briana Hull, Briana Hull MR#: [id number]

## 2014-03-24 ENCOUNTER — Telehealth: Payer: Self-pay | Admitting: *Deleted

## 2014-03-24 NOTE — Telephone Encounter (Signed)
  Follow up Call-  Call back number 03/21/2014  Post procedure Call Back phone  # [phone]  Permission to leave phone message Yes     Patient questions:  Do you have a fever, pain , or abdominal swelling? No. Pain Score  0 *  Have you tolerated food without any problems? Yes.    Have you been able to return to your normal activities? Yes.    Do you have any questions about your discharge instructions: Diet   No. Medications  No. Follow up visit  No.  Do you have questions or concerns about your Care? No.  Actions: * If pain score is 4 or above: No action needed, pain <4.

## 2014-03-25 ENCOUNTER — Encounter: Payer: Self-pay | Admitting: Gastroenterology

## 2014-04-14 ENCOUNTER — Telehealth: Payer: Self-pay | Admitting: *Deleted

## 2014-04-14 NOTE — Telephone Encounter (Signed)
Medication Detail      Disp Refills Start End     carisoprodol (SOMA) 350 MG tablet 60 tablet 1 02/10/2014     Sig: Take 1 tablet daily for 3 days. Then increase to 1 tablet twice daily.    Class: No Print    Notes to Pharmacy: Rx faxed to pharmacy   Associated Diagnoses    Muscle spasms of both lower extremities - Primary      Last OV: 11.13.15 ROV: 1-2 Mths; pt Canceled 01.15.16 appointment and did not R/S Please Advise on refills/SLS

## 2014-04-14 NOTE — Telephone Encounter (Signed)
Needs appointment

## 2014-04-15 NOTE — Telephone Encounter (Signed)
Please call Patient and arrange follow-up appointment prior to medication refill per provider instructions/SLS Thanks.

## 2014-04-15 NOTE — Telephone Encounter (Signed)
Scheduled follow up appointment for 04/16/14

## 2014-04-16 ENCOUNTER — Ambulatory Visit (INDEPENDENT_AMBULATORY_CARE_PROVIDER_SITE_OTHER): Payer: BLUE CROSS/BLUE SHIELD | Admitting: Physician Assistant

## 2014-04-16 ENCOUNTER — Encounter: Payer: Self-pay | Admitting: Physician Assistant

## 2014-04-16 VITALS — BP 108/61 | HR 58 | Temp 98.1°F | Resp 16 | Ht 64.0 in | Wt 213.4 lb

## 2014-04-16 DIAGNOSIS — M62838 Other muscle spasm: Secondary | ICD-10-CM

## 2014-04-16 DIAGNOSIS — I10 Essential (primary) hypertension: Secondary | ICD-10-CM

## 2014-04-16 MED ORDER — ALBUTEROL SULFATE HFA 108 (90 BASE) MCG/ACT IN AERS: 1.0000 | INHALATION_SPRAY | Freq: Four times a day (QID) | RESPIRATORY_TRACT | Status: DC | PRN

## 2014-04-16 MED ORDER — CARISOPRODOL 350 MG PO TABS: 350.0000 mg | ORAL_TABLET | Freq: Two times a day (BID) | ORAL | Status: DC

## 2014-04-16 NOTE — Assessment & Plan Note (Signed)
BP at low end of normal. We'll discontinue Lotensin. Continue atenolol at same dose. DASH diet discussed. Follow-up in one month.

## 2014-04-16 NOTE — Patient Instructions (Signed)
Please continue the Soma twice daily as directed. I have faxed over a new prescription to your pharmacy with additional refills.  Please consider adding on a tumor capsule daily to help with inflammation. Any standard daily supplement dose should be sufficient.  Your blood pressure as almost low today. I recommend that we stop the benazepril (Lotensin) completely for now. Continue to watch her diet and stay active. Continue the atenolol as directed. Follow-up in one month.  Please look at your my chart account and call the number listed on the login page. They can help you figure out why you were unable to access certain portions of your MyChart Account.

## 2014-04-16 NOTE — Progress Notes (Signed)
Patient presents to clinic today for medication management and follow-up of her blood pressure.  Patient endorses marked improvement in her muscle spasms with Soma twice a day. Has upcoming follow-up with her rheumatologist to discuss joint stiffness and muscle spasm further. Is exercising daily.  Patient endorses taking her 10 mg of Lotensin daily as well as her atenolol. Patient denies chest pain, palpitations, lightheadedness, dizziness, vision changes or frequent headaches.  BP 108/61 today in clinic.     Past Medical History  Diagnosis Date  . Frequent headaches   . Migraine   . Duodenal ulcer   . Hay fever   . Seasonal allergies   . Environmental allergies   . Hypertension   . Arthritis   . History of chicken pox   . Allergy     seasonal  . Cataract     one removed, left still there  . Osteoporosis     Current Outpatient Prescriptions on File Prior to Visit  Medication Sig Dispense Refill  . atenolol (TENORMIN) 25 MG tablet Take 25 mg by mouth daily.    . Calcium Carbonate-Vitamin D (CALCIUM + D PO) Take 1 tablet by mouth daily.    . fluticasone (FLONASE) 50 MCG/ACT nasal spray Place 2 sprays into both nostrils daily as needed for allergies or rhinitis.    . Glucosamine-Chondroitin (GLUCOSAMINE CHONDR COMPLEX PO) Take 1 tablet by mouth daily.    Marland Kitchen ibuprofen (ADVIL,MOTRIN) 200 MG tablet Take 200 mg by mouth every 6 (six) hours as needed for headache or moderate pain.     No current facility-administered medications on file prior to visit.    Allergies  Allergen Reactions  . Codeine Nausea And Vomiting  . Other Diarrhea and Nausea And Vomiting    MSG    Family History  Problem Relation Age of Onset  . Breast cancer Mother 21    Deceased  . Stroke Mother   . Hypertension Mother   . Ovarian cancer Sister   . Renal cancer Father   . Lung cancer Father 14    Deceased  . Melanoma Father   . Heart disease Maternal Grandmother   . Stomach cancer Paternal  Grandfather   . Cancer - Other Maternal Aunt     Pancreatic  . Diabetes Brother   . Heart disease Maternal Aunt   . Heart attack Maternal Aunt   . Melanoma Paternal Aunt   . Multiple myeloma Paternal Aunt   . Brain cancer Brother     G-Blastoma  . Lung cancer Brother   . Lung cancer Maternal Aunt   . Colon cancer Neg Hx   . Esophageal cancer Neg Hx   . Rectal cancer Cousin     History   Social History  . Marital Status: Single    Spouse Name: N/A  . Number of Children: N/A  . Years of Education: N/A   Social History Main Topics  . Smoking status: Former Research scientist (life sciences)  . Smokeless tobacco: Never Used     Comment: Quit >30 yrs  . Alcohol Use: 0.0 oz/week    0 Standard drinks or equivalent per week     Comment: social  . Drug Use: No  . Sexual Activity: Not on file   Other Topics Concern  . None   Social History Narrative   Review of Systems - See HPI.  All other ROS are negative.  BP 108/61 mmHg  Pulse 58  Temp(Src) 98.1 F (36.7 C) (Oral)  Resp 16  Ht 5' 4" (1.626 m)  Wt 213 lb 6 oz (96.786 kg)  BMI 36.61 kg/m2  SpO2 98%  Physical Exam  Constitutional: She is oriented to person, place, and time and well-developed, well-nourished, and in no distress.  HENT:  Head: Normocephalic and atraumatic.  Cardiovascular: Normal rate.   Pulmonary/Chest: Effort normal.  Neurological: She is alert and oriented to person, place, and time.  Skin: Skin is warm and dry. No rash noted.  Psychiatric: Affect normal.  Vitals reviewed.  Assessment/Plan: Essential hypertension BP at low end of normal. We'll discontinue Lotensin. Continue atenolol at same dose. DASH diet discussed. Follow-up in one month.   Muscle spasms of both lower extremities Good improvement noted. Will continue Soma. Encouraged over-the-counter tumeric supplement. Follow-up with rheumatology as scheduled.

## 2014-04-16 NOTE — Assessment & Plan Note (Signed)
Good improvement noted. Will continue Soma. Encouraged over-the-counter tumeric supplement. Follow-up with rheumatology as scheduled.

## 2014-04-16 NOTE — Progress Notes (Signed)
Pre visit review using our clinic review tool, if applicable. No additional management support is needed unless otherwise documented below in the visit note/SLS  

## 2014-05-21 ENCOUNTER — Encounter: Payer: Self-pay | Admitting: Physician Assistant

## 2014-05-21 ENCOUNTER — Ambulatory Visit (INDEPENDENT_AMBULATORY_CARE_PROVIDER_SITE_OTHER): Payer: BLUE CROSS/BLUE SHIELD | Admitting: Physician Assistant

## 2014-05-21 VITALS — BP 118/78 | HR 80 | Temp 98.0°F | Resp 14 | Ht 63.0 in | Wt 214.0 lb

## 2014-05-21 DIAGNOSIS — I10 Essential (primary) hypertension: Secondary | ICD-10-CM

## 2014-05-21 DIAGNOSIS — Z0289 Encounter for other administrative examinations: Secondary | ICD-10-CM | POA: Diagnosis not present

## 2014-05-21 DIAGNOSIS — Z7689 Persons encountering health services in other specified circumstances: Secondary | ICD-10-CM

## 2014-05-21 MED ORDER — ATENOLOL 25 MG PO TABS: 25.0000 mg | ORAL_TABLET | Freq: Every day | ORAL | Status: DC

## 2014-05-21 NOTE — Assessment & Plan Note (Signed)
Referral placed to OBGYN

## 2014-05-21 NOTE — Patient Instructions (Signed)
Please continue Atenolol as directed. Stay active. Eat a well-balanced diet.  Please start an over-the-counter claritin in addition to flonase to help with allergy symptoms.  Follow-up in 6 months.

## 2014-05-21 NOTE — Assessment & Plan Note (Signed)
BP improved.  Will continue current regimen.  Medications refilled. Follow-up 6 months.

## 2014-05-21 NOTE — Progress Notes (Signed)
Pre visit review using our clinic review tool, if applicable. No additional management support is needed unless otherwise documented below in the visit note. 

## 2014-05-21 NOTE — Progress Notes (Signed)
Patient presents to clinic today for follow-up of hypertension.  Was taken off of Lotensin at last visit.  Patient denies chest pain, palpitations, lightheadedness, dizziness, vision changes or frequent headaches.  Patient also having worsening of seasonal allergies despite Flonase daily.  Is wondering if she can start OTC Claritin daily.  Endorses rhinorrhea and itchy eyes.  Past Medical History  Diagnosis Date  . Frequent headaches   . Migraine   . Duodenal ulcer   . Hay fever   . Seasonal allergies   . Environmental allergies   . Hypertension   . Arthritis   . History of chicken pox   . Allergy     seasonal  . Cataract     one removed, left still there  . Osteoporosis     Current Outpatient Prescriptions on File Prior to Visit  Medication Sig Dispense Refill  . albuterol (PROVENTIL HFA;VENTOLIN HFA) 108 (90 BASE) MCG/ACT inhaler Inhale 1-2 puffs into the lungs every 6 (six) hours as needed for wheezing or shortness of breath. 1 Inhaler 2  . Calcium Carbonate-Vitamin D (CALCIUM + D PO) Take 1 tablet by mouth daily.    . fluticasone (FLONASE) 50 MCG/ACT nasal spray Place 2 sprays into both nostrils daily as needed for allergies or rhinitis.    . Glucosamine-Chondroitin (GLUCOSAMINE CHONDR COMPLEX PO) Take 1 tablet by mouth daily.    Marland Kitchen ibuprofen (ADVIL,MOTRIN) 200 MG tablet Take 200 mg by mouth every 6 (six) hours as needed for headache or moderate pain.     No current facility-administered medications on file prior to visit.    Allergies  Allergen Reactions  . Codeine Nausea And Vomiting  . Other Diarrhea and Nausea And Vomiting    MSG    Family History  Problem Relation Age of Onset  . Breast cancer Mother 23    Deceased  . Stroke Mother   . Hypertension Mother   . Ovarian cancer Sister   . Renal cancer Father   . Lung cancer Father 22    Deceased  . Melanoma Father   . Heart disease Maternal Grandmother   . Stomach cancer Paternal Grandfather   . Cancer -  Other Maternal Aunt     Pancreatic  . Diabetes Brother   . Heart disease Maternal Aunt   . Heart attack Maternal Aunt   . Melanoma Paternal Aunt   . Multiple myeloma Paternal Aunt   . Brain cancer Brother     G-Blastoma  . Lung cancer Brother   . Lung cancer Maternal Aunt   . Colon cancer Neg Hx   . Esophageal cancer Neg Hx   . Rectal cancer Cousin     History   Social History  . Marital Status: Single    Spouse Name: N/A  . Number of Children: N/A  . Years of Education: N/A   Social History Main Topics  . Smoking status: Former Research scientist (life sciences)  . Smokeless tobacco: Never Used     Comment: Quit >30 yrs  . Alcohol Use: 0.0 oz/week    0 Standard drinks or equivalent per week     Comment: social  . Drug Use: No  . Sexual Activity: Not on file   Other Topics Concern  . None   Social History Narrative   Review of Systems - See HPI.  All other ROS are negative.  BP 118/78 mmHg  Pulse 80  Temp(Src) 98 F (36.7 C) (Oral)  Resp 14  Ht 5' 3"  (1.6 m)  Wt 214 lb (97.07 kg)  BMI 37.92 kg/m2  Physical Exam  Constitutional: She is oriented to person, place, and time and well-developed, well-nourished, and in no distress.  HENT:  Head: Normocephalic and atraumatic.  Eyes: Conjunctivae are normal.  Neck: Neck supple.  Cardiovascular: Normal rate, regular rhythm, normal heart sounds and intact distal pulses.   Pulmonary/Chest: Effort normal and breath sounds normal. No respiratory distress. She has no wheezes. She has no rales. She exhibits no tenderness.  Neurological: She is alert and oriented to person, place, and time.  Skin: Skin is warm and dry. No rash noted.  Psychiatric: Affect normal.  Vitals reviewed.  Assessment/Plan: Essential hypertension BP improved.  Will continue current regimen.  Medications refilled. Follow-up 6 months.   Referral of patient Referral placed to OB/GYN.

## 2014-05-23 ENCOUNTER — Ambulatory Visit: Payer: BLUE CROSS/BLUE SHIELD | Admitting: Physician Assistant

## 2014-07-16 ENCOUNTER — Other Ambulatory Visit: Payer: Self-pay

## 2014-07-16 DIAGNOSIS — Z1239 Encounter for other screening for malignant neoplasm of breast: Secondary | ICD-10-CM

## 2014-12-02 ENCOUNTER — Encounter: Payer: Self-pay | Admitting: Physician Assistant

## 2014-12-02 ENCOUNTER — Ambulatory Visit (INDEPENDENT_AMBULATORY_CARE_PROVIDER_SITE_OTHER): Payer: BLUE CROSS/BLUE SHIELD | Admitting: Physician Assistant

## 2014-12-02 VITALS — BP 125/59 | HR 71 | Temp 98.2°F | Resp 16 | Ht 63.0 in | Wt 214.1 lb

## 2014-12-02 DIAGNOSIS — J309 Allergic rhinitis, unspecified: Secondary | ICD-10-CM | POA: Diagnosis not present

## 2014-12-02 DIAGNOSIS — Z23 Encounter for immunization: Secondary | ICD-10-CM

## 2014-12-02 DIAGNOSIS — E669 Obesity, unspecified: Secondary | ICD-10-CM | POA: Diagnosis not present

## 2014-12-02 DIAGNOSIS — I10 Essential (primary) hypertension: Secondary | ICD-10-CM | POA: Diagnosis not present

## 2014-12-02 DIAGNOSIS — R350 Frequency of micturition: Secondary | ICD-10-CM | POA: Insufficient documentation

## 2014-12-02 MED ORDER — ORLISTAT 120 MG PO CAPS: 120.0000 mg | ORAL_CAPSULE | Freq: Three times a day (TID) | ORAL | Status: DC

## 2014-12-02 MED ORDER — ATENOLOL 25 MG PO TABS: 25.0000 mg | ORAL_TABLET | Freq: Every day | ORAL | Status: DC

## 2014-12-02 NOTE — Patient Instructions (Signed)
Please go to the lab for blood work. I will call with your results. Please continue BP medications as directed. Your insurance requires paperwork to start the weight loss medication. Have your pharmacy send [place] the paperwork [phone] for the orlistat and we will take care of it for you.  Follow-up in 6 months.

## 2014-12-02 NOTE — Assessment & Plan Note (Signed)
Well-controlled. Continue current regimen. Medications refilled.

## 2014-12-02 NOTE — Progress Notes (Signed)
Patient presents to clinic today for 59-monthfollow-up of hypertension. Is currently prescribed Atenolol 25 mg daily. Endorses taking as directed. Patient denies chest pain, palpitations, lightheadedness, dizziness, vision changes or frequent headaches.  Patient endorses flare-up of her allergic rhinitis with watery eyes, rhinorrhea and nasal congestion. Has noticed it has been worse this year. Is taking OTC allergy eye drops and Claritin with some relief in symptoms. Is not using her Flonase as directed. Denies allergic asthma in quite a while. Is out of her albuterol medication.   Patient wishes to discuss options for weight loss. Body mass index is 37.94 kg/(m^2). Is working > 50 hours a week which involves a lot of walking and lifting. Is eating 3 meals a day with healthy snacks between meals. Previous thyroid tests have been unremarkable.   Patient requests flu shot today.   Past Medical History  Diagnosis Date  . Frequent headaches   . Migraine   . Duodenal ulcer   . Hay fever   . Seasonal allergies   . Environmental allergies   . Hypertension   . Arthritis   . History of chicken pox   . Allergy     seasonal  . Cataract     one removed, left still there  . Osteoporosis     Current Outpatient Prescriptions on File Prior to Visit  Medication Sig Dispense Refill  . albuterol (PROVENTIL HFA;VENTOLIN HFA) 108 (90 BASE) MCG/ACT inhaler Inhale 1-2 puffs into the lungs every 6 (six) hours as needed for wheezing or shortness of breath. 1 Inhaler 2  . Calcium Carbonate-Vitamin D (CALCIUM + D PO) Take 1 tablet by mouth daily.    . fluticasone (FLONASE) 50 MCG/ACT nasal spray Place 2 sprays into both nostrils daily as needed for allergies or rhinitis.    . Glucosamine-Chondroitin (GLUCOSAMINE CHONDR COMPLEX PO) Take 1 tablet by mouth daily.    .Marland Kitchenibuprofen (ADVIL,MOTRIN) 200 MG tablet Take 200 mg by mouth every 6 (six) hours as needed for headache or moderate pain.     No current  facility-administered medications on file prior to visit.    Allergies  Allergen Reactions  . Codeine Nausea And Vomiting  . Other Diarrhea and Nausea And Vomiting    MSG    Family History  Problem Relation Age of Onset  . Breast cancer Mother 714   Deceased  . Stroke Mother   . Hypertension Mother   . Ovarian cancer Sister   . Renal cancer Father   . Lung cancer Father 561   Deceased  . Melanoma Father   . Heart disease Maternal Grandmother   . Stomach cancer Paternal Grandfather   . Cancer - Other Maternal Aunt     Pancreatic  . Diabetes Brother   . Heart disease Maternal Aunt   . Heart attack Maternal Aunt   . Melanoma Paternal Aunt   . Multiple myeloma Paternal Aunt   . Brain cancer Brother     G-Blastoma  . Lung cancer Brother   . Lung cancer Maternal Aunt   . Colon cancer Neg Hx   . Esophageal cancer Neg Hx   . Rectal cancer Cousin     Social History   Social History  . Marital Status: Single    Spouse Name: N/A  . Number of Children: N/A  . Years of Education: N/A   Social History Main Topics  . Smoking status: Former SResearch scientist (life sciences) . Smokeless tobacco: Never Used     Comment:  Quit >30 yrs  . Alcohol Use: 0.0 oz/week    0 Standard drinks or equivalent per week     Comment: social  . Drug Use: No  . Sexual Activity: Not Asked   Other Topics Concern  . None   Social History Narrative   Review of Systems - See HPI.  All other ROS are negative.  BP 125/59 mmHg  Pulse 71  Temp(Src) 98.2 F (36.8 C) (Oral)  Resp 16  Ht _0  (1.6 m)  Wt 214 lb 2 oz (97.126 kg)  BMI 37.94 kg/m2  SpO2 100%  Physical Exam  Constitutional: She is oriented to person, place, and time and well-developed, well-nourished, and in no distress.  HENT:  Head: Normocephalic and atraumatic.  Right Ear: External ear normal.  Left Ear: External ear normal.  Nose: Nose normal.  Mouth/Throat: Oropharynx is clear and moist. No oropharyngeal exudate.  TM within normal limits  bilaterally.  Eyes: Conjunctivae are normal.  Neck: Neck supple.  Cardiovascular: Normal rate, regular rhythm, normal heart sounds and intact distal pulses.   Pulmonary/Chest: Effort normal and breath sounds normal. No respiratory distress. She has no wheezes. She has no rales. She exhibits no tenderness.  Neurological: She is alert and oriented to person, place, and time.  Skin: Skin is warm and dry. No rash noted.  Psychiatric: Affect normal.  Vitals reviewed.  No results found for this or any previous visit (from the past 2160 hour(s)).  Assessment/Plan: Urinary frequency Will check BMP, A1C and UA today. Sounds like OAB. Patient does not want treatment for OAB just wanting to make sure sugar and urine look ok.  Obesity Body mass index is 37.94 kg/(m^2). Discussed diet and exercise regimen. Continue as directed. Will get PA for Orlistat (Xenical) 120 mg TID to begin as directed. Will repeat TSH today.  Essential hypertension Well-controlled. Continue current regimen. Medications refilled.  Rhinitis, allergic Resume Claritin and Flonase. Air purifier in bedroom to help nighttime symptoms. Follow-up PRN.

## 2014-12-02 NOTE — Assessment & Plan Note (Signed)
Resume Claritin and Flonase. Air purifier in bedroom to help nighttime symptoms. Follow-up PRN.

## 2014-12-02 NOTE — Progress Notes (Signed)
Pre visit review using our clinic review tool, if applicable. No additional management support is needed unless otherwise documented below in the visit note/SLS  

## 2014-12-02 NOTE — Assessment & Plan Note (Signed)
Will check BMP, A1C and UA today. Sounds like OAB. Patient does not want treatment for OAB just wanting to make sure sugar and urine look ok.

## 2014-12-02 NOTE — Assessment & Plan Note (Signed)
Body mass index is 37.94 kg/(m^2). Discussed diet and exercise regimen. Continue as directed. Will get PA for Orlistat (Xenical) 120 mg TID to begin as directed. Will repeat TSH today.

## 2014-12-03 ENCOUNTER — Encounter: Payer: Self-pay | Admitting: Physician Assistant

## 2014-12-03 ENCOUNTER — Telehealth: Payer: Self-pay | Admitting: *Deleted

## 2014-12-03 LAB — BASIC METABOLIC PANEL
BUN: 14 mg/dL (ref 6–23)
CO2: 31 mEq/L (ref 19–32)
Calcium: 9.6 mg/dL (ref 8.4–10.5)
Chloride: 104 mEq/L (ref 96–112)
Creatinine, Ser: 0.75 mg/dL (ref 0.40–1.20)
GFR: 82.9 mL/min (ref >60.00)
Glucose, Bld: 106 mg/dL — ABNORMAL HIGH (ref 70–99)
POTASSIUM: 3.7 meq/L (ref 3.5–5.1)
SODIUM: 141 meq/L (ref 135–145)

## 2014-12-03 LAB — URINALYSIS, ROUTINE W REFLEX MICROSCOPIC
Bilirubin Urine: NEGATIVE
KETONES UR: NEGATIVE
LEUKOCYTES UA: NEGATIVE
Nitrite: NEGATIVE
Total Protein, Urine: NEGATIVE
URINE GLUCOSE: NEGATIVE
UROBILINOGEN UA: 0.2 (ref 0.0–1.0)
pH: 5.5 (ref 5.0–8.0)

## 2014-12-03 LAB — TSH: TSH: 1.08 u[IU]/mL (ref 0.35–4.50)

## 2014-12-03 LAB — HEMOGLOBIN A1C: HEMOGLOBIN A1C: 5.1 % (ref 4.6–6.5)

## 2014-12-03 MED ORDER — FLUTICASONE PROPIONATE 50 MCG/ACT NA SUSP: 2.0000 | Freq: Every day | NASAL | Status: AC | PRN

## 2014-12-03 NOTE — Telephone Encounter (Signed)
PA initiated through [facility]. Awaiting determination. JG/CMA

## 2014-12-04 MED ORDER — ALBUTEROL SULFATE HFA 108 (90 BASE) MCG/ACT IN AERS: 1.0000 | INHALATION_SPRAY | Freq: Four times a day (QID) | RESPIRATORY_TRACT | Status: DC | PRN

## 2014-12-04 NOTE — Addendum Note (Signed)
Addended by: Raiford Noble on: 12/04/2014 07:30 AM   Modules accepted: Orders

## 2014-12-05 ENCOUNTER — Other Ambulatory Visit: Payer: Self-pay | Admitting: Physician Assistant

## 2014-12-05 MED ORDER — ALBUTEROL SULFATE HFA 108 (90 BASE) MCG/ACT IN AERS: 1.0000 | INHALATION_SPRAY | Freq: Four times a day (QID) | RESPIRATORY_TRACT | Status: AC | PRN

## 2014-12-18 NOTE — Telephone Encounter (Signed)
PA denied because medication is not covered by insurance when used for Obesity. Insurance states that any medication used for Obesity will require a PA. Please advise.

## 2014-12-18 NOTE — Telephone Encounter (Signed)
Ok to initiate PA.

## 2015-03-30 ENCOUNTER — Ambulatory Visit (HOSPITAL_BASED_OUTPATIENT_CLINIC_OR_DEPARTMENT_OTHER)
Admission: RE | Admit: 2015-03-30 | Discharge: 2015-03-30 | Disposition: A | Discharge status: Home / Self Care | Payer: BLUE CROSS/BLUE SHIELD | Source: Ambulatory Visit | Attending: Physician Assistant | Admitting: Physician Assistant

## 2015-03-30 ENCOUNTER — Encounter: Payer: Self-pay | Admitting: Physician Assistant

## 2015-03-30 ENCOUNTER — Ambulatory Visit (INDEPENDENT_AMBULATORY_CARE_PROVIDER_SITE_OTHER): Payer: BLUE CROSS/BLUE SHIELD | Admitting: Physician Assistant

## 2015-03-30 VITALS — BP 135/69 | HR 63 | Temp 98.2°F | Ht 63.0 in | Wt 210.2 lb

## 2015-03-30 DIAGNOSIS — M549 Dorsalgia, unspecified: Secondary | ICD-10-CM | POA: Insufficient documentation

## 2015-03-30 DIAGNOSIS — R399 Unspecified symptoms and signs involving the genitourinary system: Secondary | ICD-10-CM | POA: Insufficient documentation

## 2015-03-30 DIAGNOSIS — R109 Unspecified abdominal pain: Secondary | ICD-10-CM

## 2015-03-30 DIAGNOSIS — R350 Frequency of micturition: Secondary | ICD-10-CM

## 2015-03-30 LAB — POCT URINALYSIS DIPSTICK
Bilirubin, UA: NEGATIVE
Blood, UA: NEGATIVE
Glucose, UA: NEGATIVE
Ketones, UA: NEGATIVE
Leukocytes, UA: NEGATIVE
Nitrite, UA: NEGATIVE
Protein, UA: NEGATIVE
Spec Grav, UA: 1.015
Urobilinogen, UA: 4
pH, UA: 6

## 2015-03-30 MED ORDER — CEPHALEXIN 500 MG PO CAPS: 500.0000 mg | ORAL_CAPSULE | Freq: Two times a day (BID) | ORAL | Status: DC

## 2015-03-30 MED ORDER — TRAMADOL HCL 50 MG PO TABS: 50.0000 mg | ORAL_TABLET | Freq: Three times a day (TID) | ORAL | Status: DC | PRN

## 2015-03-30 NOTE — Progress Notes (Signed)
Pre visit review using our clinic review tool, if applicable. No additional management support is needed unless otherwise documented below in the visit note. 

## 2015-03-30 NOTE — Assessment & Plan Note (Signed)
Urine dip negative. Will send for culture. Giving history and classic symptoms with Rx Keflex 500 mg BID until culture results. Supportive measures reviewed. Will Rx Tramadol for pain. Will check KUB today to r/o calcium stone.

## 2015-03-30 NOTE — Progress Notes (Signed)
Patient presents to clinic today c/o 3 days of low back pain and suprapubic pressure associated with urinary urgency, frequency and dysuria. Denies hematuria. Endorses she feels like she may have passed something in her urine. Has history of calcium oxalate in urine but denies history of actual kidney stone. Endorses nausea but denies flank pain. Denies fever or chills. Denies injury to back or pain with ROM.   Past Medical History  Diagnosis Date  . Frequent headaches   . Migraine   . Duodenal ulcer   . Hay fever   . Seasonal allergies   . Environmental allergies   . Hypertension   . Arthritis   . History of chicken pox   . Allergy     seasonal  . Cataract     one removed, left still there  . Osteoporosis     Current Outpatient Prescriptions on File Prior to Visit  Medication Sig Dispense Refill  . albuterol (PROVENTIL HFA;VENTOLIN HFA) 108 (90 BASE) MCG/ACT inhaler Inhale 1-2 puffs into the lungs every 6 (six) hours as needed for wheezing or shortness of breath. 1 Inhaler 2  . atenolol (TENORMIN) 25 MG tablet Take 1 tablet (25 mg total) by mouth daily. 90 tablet 1  . fluticasone (FLONASE) 50 MCG/ACT nasal spray Place 2 sprays into both nostrils daily as needed for allergies or rhinitis. 16 g 5  . Calcium Carbonate-Vitamin D (CALCIUM + D PO) Take 1 tablet by mouth daily. Reported on 03/30/2015    . diphenhydrAMINE (BENADRYL) 25 MG tablet Take 25 mg by mouth every 6 (six) hours as needed for allergies. Reported on 03/30/2015    . Glucosamine-Chondroitin (GLUCOSAMINE CHONDR COMPLEX PO) Take 1 tablet by mouth daily. Reported on 03/30/2015    . ibuprofen (ADVIL,MOTRIN) 200 MG tablet Take 200 mg by mouth every 6 (six) hours as needed for headache or moderate pain. Reported on 03/30/2015    . loratadine (CLARITIN) 10 MG tablet Take 10 mg by mouth daily as needed for allergies or rhinitis. Reported on 03/30/2015    . orlistat (XENICAL) 120 MG capsule Take 1 capsule (120 mg total) by mouth 3  (three) times daily with meals. 90 capsule 1  . Propylene Glycol (SYSTANE BALANCE) 0.6 % SOLN Apply to eye daily as needed (Allergic Rhinitis). Reported on 03/30/2015     No current facility-administered medications on file prior to visit.    Allergies  Allergen Reactions  . Codeine Nausea And Vomiting  . Other Diarrhea and Nausea And Vomiting    MSG    Family History  Problem Relation Age of Onset  . Breast cancer Mother 38    Deceased  . Stroke Mother   . Hypertension Mother   . Ovarian cancer Sister   . Renal cancer Father   . Lung cancer Father 15    Deceased  . Melanoma Father   . Heart disease Maternal Grandmother   . Stomach cancer Paternal Grandfather   . Cancer - Other Maternal Aunt     Pancreatic  . Diabetes Brother   . Heart disease Maternal Aunt   . Heart attack Maternal Aunt   . Melanoma Paternal Aunt   . Multiple myeloma Paternal Aunt   . Brain cancer Brother     G-Blastoma  . Lung cancer Brother   . Lung cancer Maternal Aunt   . Colon cancer Neg Hx   . Esophageal cancer Neg Hx   . Rectal cancer Cousin     Social History  Social History  . Marital Status: Single    Spouse Name: N/A  . Number of Children: N/A  . Years of Education: N/A   Social History Main Topics  . Smoking status: Former Research scientist (life sciences)  . Smokeless tobacco: Never Used     Comment: Quit >30 yrs  . Alcohol Use: 0.0 oz/week    0 Standard drinks or equivalent per week     Comment: social  . Drug Use: No  . Sexual Activity: Not Asked   Other Topics Concern  . None   Social History Narrative   Review of Systems - See HPI.  All other ROS are negative.  BP 135/69 mmHg  Pulse 63  Temp(Src) 98.2 F (36.8 C) (Oral)  Ht 5' 3"  (1.6 m)  Wt 210 lb 3.2 oz (95.346 kg)  BMI 37.24 kg/m2  SpO2 100%  Physical Exam  Constitutional: She is oriented to person, place, and time.  Cardiovascular: Normal rate, regular rhythm, normal heart sounds and intact distal pulses.   Pulmonary/Chest:  Effort normal and breath sounds normal. No respiratory distress. She has no wheezes. She has no rales. She exhibits no tenderness.  Abdominal: Soft. Bowel sounds are normal. She exhibits no distension.  Negative CVA tenderness. Positive suprapubic tenderness  Musculoskeletal:       Lumbar back: She exhibits pain and spasm. She exhibits normal range of motion, no tenderness and no bony tenderness.  Neurological: She is alert and oriented to person, place, and time.  Skin: Skin is warm and dry. No rash noted.  Psychiatric: Affect normal.  Vitals reviewed.   Recent Results (from the past 2160 hour(s))  POCT Urinalysis Dipstick     Status: Abnormal   Collection Time: 03/30/15 12:11 PM  Result Value Ref Range   Color, UA Pale Yelllow    Clarity, UA Clear    Glucose, UA Neg    Bilirubin, UA Neg    Ketones, UA Neg    Spec Grav, UA 1.015    Blood, UA Neg    pH, UA 6.0    Protein, UA Neg    Urobilinogen, UA 4.0    Nitrite, UA Neg    Leukocytes, UA Negative Negative    Assessment/Plan: UTI symptoms Urine dip negative. Will send for culture. Giving history and classic symptoms with Rx Keflex 500 mg BID until culture results. Supportive measures reviewed. Will Rx Tramadol for pain. Will check KUB today to r/o calcium stone.

## 2015-03-30 NOTE — Patient Instructions (Signed)
Please go downstairs for x-ray. Increase fluids, begin pain medication and antibiotic. I will call you with your x-ray results so we can determine next steps.  If anything acutely worsens, please go to the Er.

## 2015-04-01 LAB — CULTURE, URINE COMPREHENSIVE

## 2015-04-02 ENCOUNTER — Telehealth: Payer: Self-pay | Admitting: Physician Assistant

## 2015-04-02 NOTE — Telephone Encounter (Signed)
Relationship to patient: Self   Can be reached: [phone]   Reason for call: pt called in, she says that the [facility] has already called her and went over lab results. She says that she would like to discuss them further with PCP if possible.   Please advise.

## 2015-04-03 NOTE — Telephone Encounter (Signed)
Pt is returning your call.  She says that she is at work so if PCP could try calling her a few times to get her because she does have lab result concerns. Informed pt of message from PCP below. She expressed understanding.

## 2015-04-03 NOTE — Telephone Encounter (Signed)
Called and [facility] @ 8:30am @ [phone]) asking the pt to RTC regarding note below.//AB/CMA

## 2015-04-03 NOTE — Telephone Encounter (Signed)
Spoke with patient. Is feeling well, mild nausea from ABX. No other residual symptoms. Is scheduled for lab on Monday for repeat UA, culture and BMP.

## 2015-04-03 NOTE — Telephone Encounter (Signed)
Will you please call patient to assess what concerns/questions she has? I will try to call between morning and afternoon clinic If I have time, but knowing her concerns will help me relay them to her via staff if I do not get a chance to call.

## 2015-04-06 ENCOUNTER — Other Ambulatory Visit (INDEPENDENT_AMBULATORY_CARE_PROVIDER_SITE_OTHER): Payer: BLUE CROSS/BLUE SHIELD

## 2015-04-06 DIAGNOSIS — N3000 Acute cystitis without hematuria: Secondary | ICD-10-CM | POA: Diagnosis not present

## 2015-04-06 LAB — URINALYSIS, ROUTINE W REFLEX MICROSCOPIC
BILIRUBIN URINE: NEGATIVE
KETONES UR: NEGATIVE
Leukocytes, UA: NEGATIVE
Nitrite: NEGATIVE
PH: 5.5 (ref 5.0–8.0)
SPECIFIC GRAVITY, URINE: 1.025 (ref 1.000–1.030)
TOTAL PROTEIN, URINE-UPE24: NEGATIVE
URINE GLUCOSE: NEGATIVE
UROBILINOGEN UA: 0.2 (ref 0.0–1.0)
WBC UA: NONE SEEN (ref 0–?)

## 2015-04-06 LAB — BASIC METABOLIC PANEL
BUN: 17 mg/dL (ref 6–23)
CALCIUM: 9.5 mg/dL (ref 8.4–10.5)
CO2: 29 mEq/L (ref 19–32)
CREATININE: 0.89 mg/dL (ref 0.40–1.20)
Chloride: 105 mEq/L (ref 96–112)
GFR: 67.97 mL/min (ref >60.00)
GLUCOSE: 73 mg/dL (ref 70–99)
POTASSIUM: 3.3 meq/L — AB (ref 3.5–5.1)
Sodium: 141 mEq/L (ref 135–145)

## 2015-04-08 ENCOUNTER — Other Ambulatory Visit: Payer: Self-pay | Admitting: Gastroenterology

## 2015-04-08 DIAGNOSIS — K7460 Unspecified cirrhosis of liver: Secondary | ICD-10-CM

## 2015-04-09 LAB — URINE CULTURE: Colony Count: 30000

## 2015-04-10 ENCOUNTER — Other Ambulatory Visit: Payer: Self-pay | Admitting: Physician Assistant

## 2015-04-10 ENCOUNTER — Encounter: Payer: Self-pay | Admitting: Physician Assistant

## 2015-04-10 DIAGNOSIS — D259 Leiomyoma of uterus, unspecified: Secondary | ICD-10-CM

## 2015-04-13 ENCOUNTER — Ambulatory Visit: Payer: BLUE CROSS/BLUE SHIELD

## 2015-04-27 ENCOUNTER — Encounter: Payer: BLUE CROSS/BLUE SHIELD | Admitting: Obstetrics & Gynecology

## 2015-04-27 ENCOUNTER — Encounter: Payer: Self-pay | Admitting: Obstetrics & Gynecology

## 2015-04-27 ENCOUNTER — Ambulatory Visit (INDEPENDENT_AMBULATORY_CARE_PROVIDER_SITE_OTHER): Payer: BLUE CROSS/BLUE SHIELD | Admitting: Obstetrics & Gynecology

## 2015-04-27 VITALS — BP 135/68 | HR 78 | Ht 64.0 in | Wt 210.0 lb

## 2015-04-27 DIAGNOSIS — Z113 Encounter for screening for infections with a predominantly sexual mode of transmission: Secondary | ICD-10-CM | POA: Diagnosis not present

## 2015-04-27 DIAGNOSIS — Z01419 Encounter for gynecological examination (general) (routine) without abnormal findings: Secondary | ICD-10-CM | POA: Diagnosis not present

## 2015-04-27 DIAGNOSIS — Z124 Encounter for screening for malignant neoplasm of cervix: Secondary | ICD-10-CM | POA: Diagnosis not present

## 2015-04-27 DIAGNOSIS — R32 Unspecified urinary incontinence: Secondary | ICD-10-CM

## 2015-04-27 DIAGNOSIS — Z1239 Encounter for other screening for malignant neoplasm of breast: Secondary | ICD-10-CM | POA: Diagnosis not present

## 2015-04-27 DIAGNOSIS — Z1151 Encounter for screening for human papillomavirus (HPV): Secondary | ICD-10-CM | POA: Diagnosis not present

## 2015-04-27 DIAGNOSIS — A6000 Herpesviral infection of urogenital system, unspecified: Secondary | ICD-10-CM

## 2015-04-27 DIAGNOSIS — N393 Stress incontinence (female) (male): Secondary | ICD-10-CM

## 2015-04-27 DIAGNOSIS — R102 Pelvic and perineal pain: Secondary | ICD-10-CM

## 2015-04-27 DIAGNOSIS — A609 Anogenital herpesviral infection, unspecified: Secondary | ICD-10-CM

## 2015-04-27 MED ORDER — VALACYCLOVIR HCL 500 MG PO TABS: 500.0000 mg | ORAL_TABLET | Freq: Two times a day (BID) | ORAL | Status: DC

## 2015-04-27 NOTE — Patient Instructions (Addendum)
Mammogram A mammogram is an X-ray of the breasts that is done to check for abnormal changes. This procedure can screen for and detect any changes that may suggest breast cancer. A mammogram can also identify other changes and variations in the breast, such as: 1. Inflammation of the breast tissue (mastitis). 2. An infected area that contains a collection of pus (abscess). 3. A fluid-filled sac (cyst). 4. Fibrocystic changes. This is when breast tissue becomes denser, which can make the tissue feel rope-like or uneven under the skin. 5. Tumors that are not cancerous (benign). LET [facility] CARE PROVIDER KNOW ABOUT:  Any allergies you have.  If you have breast implants.  If you have had previous breast disease, biopsy, or surgery.  If you are breastfeeding.  Any possibility that you could be pregnant, if this applies.  If you are younger than age 37.  If you have a family history of breast cancer. RISKS AND COMPLICATIONS Generally, this is a safe procedure. However, problems may occur, including:  Exposure to radiation. Radiation levels are very low with this test.  The results being misinterpreted.  The need for further tests.  The inability of the mammogram to detect certain cancers. BEFORE THE PROCEDURE  Schedule your test about 1-2 weeks after your menstrual period. This is usually when your breasts are the least tender.  If you have had a mammogram done at a different facility in the past, get the mammogram X-rays or have them sent to your current exam facility in order to compare them.  Wash your breasts and under your arms the day of the test.  Do not wear deodorants, perfumes, lotions, or powders anywhere on your body on the day of the test.  Remove any jewelry from your neck.  Wear clothes that you can change into and out of easily. PROCEDURE  You will undress from the waist up and put on a gown.  You will stand in front of the X-ray machine.  Each breast  will be placed between two plastic or glass plates. The plates will compress your breast for a few seconds. Try to stay as relaxed as possible during the procedure. This does not cause any harm to your breasts and any discomfort you feel will be very brief.  X-rays will be taken from different angles of each breast. The procedure may vary among health care providers and hospitals. AFTER THE PROCEDURE  The mammogram will be examined by a specialist (radiologist).  You may need to repeat certain parts of the test, depending on the quality of the images. This is commonly done if the radiologist needs a better view of the breast tissue.  Ask when your test results will be ready. Make sure you get your test results.  You may resume your normal activities.   This information is not intended to replace advice given to you by your health care provider. Make sure you discuss any questions you have with your health care provider.   Document Released: 01/15/2000 Document Revised: 10/08/2014 Document Reviewed: 03/28/2014 Elsevier Interactive Patient Education 2016 [employer]. Kegel Exercises The goal of Kegel exercises is to isolate and exercise your pelvic floor muscles. These muscles act as a hammock that supports the rectum, vagina, small intestine, and uterus. As the muscles weaken, the hammock sags and these organs are displaced from their normal positions. Kegel exercises can strengthen your pelvic floor muscles and help you to improve bladder and bowel control, improve sexual response, and help reduce many  problems and some discomfort during pregnancy. Kegel exercises can be done anywhere and at any time. HOW TO PERFORM KEGEL EXERCISES 6. Locate your pelvic floor muscles. To do this, squeeze (contract) the muscles that you use when you try to stop the flow of urine. You will feel a tightness in the vaginal area (women) and a tight lift in the rectal area (men and women). 7. When you begin,  contract your pelvic muscles tight for 2-5 seconds, then relax them for 2-5 seconds. This is one set. Do 4-5 sets with a short pause in between. 8. Contract your pelvic muscles for 8-10 seconds, then relax them for 8-10 seconds. Do 4-5 sets. If you cannot contract your pelvic muscles for 8-10 seconds, try 5-7 seconds and work your way up to 8-10 seconds. Your goal is 4-5 sets of 10 contractions each day. Keep your stomach, buttocks, and legs relaxed during the exercises. Perform sets of both short and long contractions. Vary your positions. Perform these contractions 3-4 times per day. Perform sets while you are:   Lying in bed in the morning.  Standing at lunch.  Sitting in the late afternoon.  Lying in bed at night. You should do 40-50 contractions per day. Do not perform more Kegel exercises per day than recommended. Overexercising can cause muscle fatigue. Continue these exercises for for at least 15-20 weeks or as directed by your caregiver.   This information is not intended to replace advice given to you by your health care provider. Make sure you discuss any questions you have with your health care provider.   Document Released: 01/04/2012 Document Revised: 02/07/2014 Document Reviewed: 01/04/2012 Elsevier Interactive Patient Education [employer].

## 2015-04-27 NOTE — Progress Notes (Signed)
Patient ID: Briana Hull, female   DOB: 1950-03-19, 65 y.o.   MRN: [id number] Subjective:     Briana Hull is a 65 y.o. female here for a routine exam. LMP 2001 (age 63 year). Current complaints: sister was recently dx'd with (probable) ovarian cancer at age 28 years of age.   Pt c/o pain on her left side. Had sono done within the year.  Pt reports a 'problem' with her urine.  Was taking atbx.  Pt c/o leakage of urine since Nov.  Reports freq urination.  Pt reports a h/o genital HSV with outbreaks every 1-3 months. She reports that she has never been offered suppression. Pt reports that she had a colonoscopy with multiple polyps with some precancerous changes.   Gynecologic History No LMP recorded. Patient is postmenopausal. Contraception: post menopausal status Last Pap: >10 years prev. Results were: normal Last mammogram: 2013. Results were: normal  Obstetric History OB History  Gravida Para Term Preterm AB SAB TAB Ectopic Multiple Living  0 0 0 0 0 0 0 0 0 0        Family history Bother brain cancer Mother breast cancer age 53's died in her 65's Father melanoma Aunt melanoma 1st cousin  melanoma bother lung cancer Grandfather stomach cancer  The following portions of the patient's history were reviewed and updated as appropriate: allergies, current medications, past family history, past medical history, past social history, past surgical history and problem list.  Review of Systems Pertinent items are noted in HPI.    Objective:  Ht 5\' 4"  (1.626 m)  Wt 210 lb (95.255 kg)  BMI 36.03 kg/m2  General Appearance:    Alert, cooperative, no distress, appears stated age  Head:    Normocephalic, without obvious abnormality, atraumatic  Eyes:    conjunctiva/corneas clear, EOM's intact, both eyes  Ears:    Normal external ear canals, both ears  Nose:   Nares normal, septum midline, mucosa normal, no drainage    or sinus tenderness  Throat:   Lips, mucosa, and tongue normal; teeth  and gums normal  Neck:   Supple, symmetrical, trachea midline, no adenopathy;    thyroid:  no enlargement/tenderness/nodules  Back:     Symmetric, no curvature, ROM normal, no CVA tenderness  Lungs:     Clear to auscultation bilaterally, respirations unlabored  Chest Wall:    No tenderness or deformity   Heart:    Regular rate and rhythm, S1 and S2 normal, no murmur, rub   or gallop  Breast Exam:    No tenderness, masses, or nipple abnormality  Abdomen:     Soft, non-tender, bowel sounds active all four quadrants,    no masses, no organomegaly; pain in LLQ.  No rebound or guarding  Genitalia:    Normal female without lesion, discharge or tenderness; no adnexal fullness.  Tenderness on left side     Extremities:   Extremities normal, atraumatic, no cyanosis or edema  Pulses:   2+ and symmetric all extremities  Skin:   Skin color, texture, turgor normal, no rashes or lesions    11/20/2013 CLINICAL DATA: Left pelvic pain for several months. 65 year old.  EXAM: TRANSABDOMINAL ULTRASOUND OF PELVIS  TECHNIQUE: Transabdominal ultrasound examination of the pelvis was performed including evaluation of the uterus, ovaries, adnexal regions, and pelvic cul-de-sac.  COMPARISON: None.  FINDINGS: Uterus  Measurements: 7.6 x 3.2 x 3.7 cm. Fundal fibroid is 1.4 x 1.3 x 1.4 cm and is subserosal in location. Mid uterine fibroid is 1.2 x  0.9 x 1.0 cm also likely sub serosal in location.  Endometrium  Thickness: 0.7 cm. No focal abnormality visualized.  Right ovary  Measurements: The ovary is not visualized, likely because of intervening bowel loops. No adnexal mass identified.  Left ovary  Measurements: 2.1 x 0.9 x 1.4 cm. Normal appearance/no adnexal mass.  Other findings: No free fluid.  IMPRESSION: 1. Two small sub serosal fibroids. Normal appearing endometrium. 2. Nonvisualized right ovary. 3. Normal appearance of the left ovary. No adnexal  mass.    Assessment:    Healthy female exam.   Incontinence- pt declines meds at this time- wants to consider Kegel exercises.    Plan:  Diagnoses and all orders for this visit:  Incontinence in female  Well woman exam with routine gynecological exam -     MM Digital Screening; Future -     Cytology - PAP  Screening for breast cancer -     MM Digital Screening; Future  Pelvic pain in female -     US Pelvis Complete; Future -     US Transvaginal Non-OB; Future  Recurrent genital HSV (herpes simplex virus) infection -     valACYclovir (VALTREX) 500 MG tablet; Take 1 tablet (500 mg total) by mouth 2 (two) times daily.  Ciela Mahajan L. Harraway-Smith, M.D., Briana Hull

## 2015-04-27 NOTE — Progress Notes (Signed)
Patient has a sister with ovarian cancer and patient is worried about her abdominal pain. Kathrene Alu RN BSN

## 2015-04-28 LAB — CYTOLOGY - PAP

## 2015-05-04 ENCOUNTER — Ambulatory Visit (HOSPITAL_BASED_OUTPATIENT_CLINIC_OR_DEPARTMENT_OTHER)
Admission: RE | Admit: 2015-05-04 | Discharge: 2015-05-04 | Disposition: A | Discharge status: Home / Self Care | Payer: BLUE CROSS/BLUE SHIELD | Source: Ambulatory Visit | Attending: Obstetrics & Gynecology | Admitting: Obstetrics & Gynecology

## 2015-05-04 DIAGNOSIS — D251 Intramural leiomyoma of uterus: Secondary | ICD-10-CM | POA: Insufficient documentation

## 2015-05-04 DIAGNOSIS — Z01419 Encounter for gynecological examination (general) (routine) without abnormal findings: Secondary | ICD-10-CM | POA: Insufficient documentation

## 2015-05-04 DIAGNOSIS — R102 Pelvic and perineal pain: Secondary | ICD-10-CM

## 2015-05-04 DIAGNOSIS — Z1239 Encounter for other screening for malignant neoplasm of breast: Secondary | ICD-10-CM | POA: Insufficient documentation

## 2015-05-04 DIAGNOSIS — Z1231 Encounter for screening mammogram for malignant neoplasm of breast: Secondary | ICD-10-CM | POA: Insufficient documentation

## 2015-05-07 ENCOUNTER — Telehealth: Payer: Self-pay

## 2015-05-07 NOTE — Telephone Encounter (Signed)
Patient called and made aware that her ultrasound had been reviewed by Dr. Ihor Dow and within normal limits. Kathrene Alu RN BSN

## 2015-05-09 ENCOUNTER — Encounter: Payer: Self-pay | Admitting: Physician Assistant

## 2015-05-25 ENCOUNTER — Other Ambulatory Visit: Payer: Self-pay | Admitting: Physician Assistant

## 2015-05-26 ENCOUNTER — Other Ambulatory Visit: Payer: Self-pay

## 2015-05-26 MED ORDER — ATENOLOL 25 MG PO TABS: ORAL_TABLET | ORAL | Status: DC

## 2015-05-26 NOTE — Telephone Encounter (Signed)
Refilled patients rx request for #90 with 1 refill.

## 2015-07-29 ENCOUNTER — Ambulatory Visit (INDEPENDENT_AMBULATORY_CARE_PROVIDER_SITE_OTHER): Payer: BLUE CROSS/BLUE SHIELD | Admitting: Family Medicine

## 2015-07-29 ENCOUNTER — Encounter: Payer: Self-pay | Admitting: Family Medicine

## 2015-07-29 VITALS — BP 113/77 | HR 66 | Ht 64.0 in | Wt 215.0 lb

## 2015-07-29 DIAGNOSIS — R350 Frequency of micturition: Secondary | ICD-10-CM | POA: Diagnosis not present

## 2015-07-29 DIAGNOSIS — R32 Unspecified urinary incontinence: Secondary | ICD-10-CM

## 2015-07-29 LAB — POCT URINALYSIS DIPSTICK
Glucose, UA: 500
Leukocytes, UA: NEGATIVE
NITRITE UA: NEGATIVE
Spec Grav, UA: 1.02
UROBILINOGEN UA: NEGATIVE

## 2015-07-29 MED ORDER — OXYBUTYNIN CHLORIDE ER 5 MG PO TB24: 5.0000 mg | ORAL_TABLET | Freq: Every day | ORAL | Status: DC

## 2015-07-29 NOTE — Progress Notes (Signed)
   Subjective:    Patient ID: Briana Hull, female    DOB: 05-Jan-1951, 65 y.o.   MRN: [id number]  HPI Patient seen for urge incontinence.  Urinating a couple times every hour and leaks some urine in between.  No stress incontinence.  Last appt, she tried Kegels, which wasn't helpful.  This has been a long standing problem.  Has less outbreaks on the Valtrex - is only taking 1/2 tab and she doesn't take it every day.   Review of Systems  Constitutional: Negative for chills and fatigue.  Genitourinary: Negative for dysuria, vaginal discharge and vaginal pain.       Objective:   Physical Exam  Constitutional: She is oriented to person, place, and time. She appears well-developed and well-nourished.  HENT:  Head: Normocephalic and atraumatic.  Right Ear: External ear normal.  Left Ear: External ear normal.  Pulmonary/Chest: Effort normal.  Abdominal: Soft. She exhibits no distension. There is no tenderness.  Neurological: She is alert and oriented to person, place, and time.  Skin: Skin is warm and dry.  Psychiatric: She has a normal mood and affect. Her behavior is normal. Judgment and thought content normal.      Assessment & Plan:  1. Urinary incontinence, unspecified incontinence type Discussed options.  Will trial medication - ditropan.  Will have her return in 3-4 months to see if improving.  Can increase dose in a couple weeks if not improving.  Urine culture sent. - POCT Urinalysis Dipstick - CULTURE, URINE COMPREHENSIVE  2. Urinary frequency - CULTURE, URINE COMPREHENSIVE

## 2015-08-01 LAB — CULTURE, URINE COMPREHENSIVE: Colony Count: 50000

## 2015-08-02 ENCOUNTER — Other Ambulatory Visit: Payer: Self-pay | Admitting: Family Medicine

## 2015-08-02 MED ORDER — CEFADROXIL 500 MG PO CAPS: 500.0000 mg | ORAL_CAPSULE | Freq: Two times a day (BID) | ORAL | Status: DC

## 2015-08-05 ENCOUNTER — Encounter: Payer: Self-pay | Admitting: Physician Assistant

## 2015-08-06 ENCOUNTER — Telehealth: Payer: Self-pay | Admitting: *Deleted

## 2015-08-06 NOTE — Telephone Encounter (Signed)
Pt called requesting her urine C and S.  Per Dr Nehemiah Settle she is positive for UTI and he sent a RX for Duracef to [facility] in [place] on 08/02/15.  Upon looking in the chart the RX was printed and was not at [facility].  The RX was then called into the pharmacy.

## 2015-08-10 ENCOUNTER — Telehealth: Payer: Self-pay

## 2015-08-10 NOTE — Telephone Encounter (Signed)
-----   Message from Truett Mainland, DO sent at 08/02/2015  7:41 PM EDT ----- Patient has positive culture.  Will treat as UTI x 7 days as pt having incontinence.  Cefadroxil 500mg  BID called in.  Please let pt know

## 2015-08-10 NOTE — Telephone Encounter (Signed)
Patient called to ensure she started the antibiotic and she states that she did. Patient states due to her symptoms though she found the name of an antibiotic online that she would like to try instead. Explained to the patient that we chose the Cefadroxil due to her urine culture and Dr. Nehemiah Settle recommendation. Patient states she will call back with name of antibiotic she found and leave for me to ask Dr. Nehemiah Settle about.

## 2015-08-24 ENCOUNTER — Ambulatory Visit (INDEPENDENT_AMBULATORY_CARE_PROVIDER_SITE_OTHER): Payer: BLUE CROSS/BLUE SHIELD | Admitting: Physician Assistant

## 2015-08-24 ENCOUNTER — Encounter: Payer: Self-pay | Admitting: Physician Assistant

## 2015-08-24 VITALS — BP 122/84 | HR 67 | Temp 97.8°F | Resp 16 | Ht 64.0 in | Wt 214.0 lb

## 2015-08-24 DIAGNOSIS — M79604 Pain in right leg: Secondary | ICD-10-CM | POA: Diagnosis not present

## 2015-08-24 DIAGNOSIS — M79605 Pain in left leg: Secondary | ICD-10-CM

## 2015-08-24 DIAGNOSIS — R002 Palpitations: Secondary | ICD-10-CM | POA: Diagnosis not present

## 2015-08-24 DIAGNOSIS — R0602 Shortness of breath: Secondary | ICD-10-CM | POA: Diagnosis not present

## 2015-08-24 LAB — CBC
HCT: 39.9 % (ref 36.0–46.0)
HEMOGLOBIN: 13.4 g/dL (ref 12.0–15.0)
MCHC: 33.7 g/dL (ref 30.0–36.0)
MCV: 93.3 fl (ref 78.0–100.0)
PLATELETS: 272 10*3/uL (ref 150.0–400.0)
RBC: 4.28 Mil/uL (ref 3.87–5.11)
RDW: 13.3 % (ref 11.5–15.5)
WBC: 6.1 10*3/uL (ref 4.0–10.5)

## 2015-08-24 LAB — TSH: TSH: 0.78 u[IU]/mL (ref 0.35–4.50)

## 2015-08-24 LAB — COMPREHENSIVE METABOLIC PANEL
ALBUMIN: 4 g/dL (ref 3.5–5.2)
ALT: 21 U/L (ref 0–35)
AST: 16 U/L (ref 0–37)
Alkaline Phosphatase: 56 U/L (ref 39–117)
BILIRUBIN TOTAL: 0.5 mg/dL (ref 0.2–1.2)
BUN: 13 mg/dL (ref 6–23)
CALCIUM: 9.5 mg/dL (ref 8.4–10.5)
CHLORIDE: 104 meq/L (ref 96–112)
CO2: 32 mEq/L (ref 19–32)
CREATININE: 0.84 mg/dL (ref 0.40–1.20)
GFR: 72.57 mL/min (ref >60.00)
Glucose, Bld: 93 mg/dL (ref 70–99)
Potassium: 3.3 mEq/L — ABNORMAL LOW (ref 3.5–5.1)
Sodium: 141 mEq/L (ref 135–145)
Total Protein: 6.2 g/dL (ref 6.0–8.3)

## 2015-08-24 LAB — MAGNESIUM: MAGNESIUM: 2.2 mg/dL (ref 1.5–2.5)

## 2015-08-24 LAB — HIGH SENSITIVITY CRP: CRP, High Sensitivity: 3.03 mg/L (ref 0.000–5.000)

## 2015-08-24 MED ORDER — TRIAMCINOLONE ACETONIDE 0.1 % EX CREA: 1.0000 "application " | TOPICAL_CREAM | Freq: Two times a day (BID) | CUTANEOUS | 0 refills | Status: DC

## 2015-08-24 NOTE — Progress Notes (Signed)
Patient presents to clinic today c/o 1 month of episodes palpitations described as a skipped beat sensation, lasting for a few seconds. Endorses episodes occurring 3 times this month. Has notes some episodes of sharp left-sided chest pain lasting a few seconds and occurring a couple of times per week. Patient notes pain occurring both with exertion and rest but there is no rhythm or pattern to symptoms. Patient also notes extreme fatigue over the past few weeks. Endorses averaging around 6-7 hours of sleep per night but takes several hours to fall asleep. Notes SOB on exertion with light activity.Patient with history of allergic-asthma. Has noted increase in albuterol use. Denies fever, chills, chest tightness or wheezing. Denies chest congestion or productive cough. Denies change in diet or activity levels. Patient denies personal history of stroke or heart attack. Has personal history of hypertension, well controlled at present.  Patient c/o potential rash of underarms bilaterally. Denies noting actual rash but notes a sand-paper like feeling under her arms. Denies pain or pruritus. Denies rash noted elsewhere.  Patient also c/o continued pain of legs bilaterally. Denies swelling, numbness or tingling. Denies cramping but notes aching of legs. Is not exercising at present. Endorses good potassium intake.   Past Medical History:  Diagnosis Date  . Allergy    seasonal  . Arthritis   . Cataract    one removed, left still there  . Duodenal ulcer   . Environmental allergies   . Frequent headaches   . Hay fever   . Herpes simplex of female genitalia   . History of chicken pox   . Hypertension   . Migraine   . Osteoporosis   . Seasonal allergies     Current Outpatient Prescriptions on File Prior to Visit  Medication Sig Dispense Refill  . albuterol (PROVENTIL HFA;VENTOLIN HFA) 108 (90 BASE) MCG/ACT inhaler Inhale 1-2 puffs into the lungs every 6 (six) hours as needed for wheezing or  shortness of breath. 1 Inhaler 2  . atenolol (TENORMIN) 25 MG tablet TAKE 1 TABLET (25 MG TOTAL) BY MOUTH DAILY. 90 tablet 1  . diphenhydrAMINE (BENADRYL) 25 MG tablet Take 25 mg by mouth every 6 (six) hours as needed for allergies. Reported on 04/27/2015    . fluticasone (FLONASE) 50 MCG/ACT nasal spray Place 2 sprays into both nostrils daily as needed for allergies or rhinitis. 16 g 5  . ibuprofen (ADVIL,MOTRIN) 200 MG tablet Take 200 mg by mouth every 6 (six) hours as needed for headache or moderate pain. Reported on 04/27/2015    . Propylene Glycol (SYSTANE BALANCE) 0.6 % SOLN Apply to eye daily as needed (Allergic Rhinitis). Reported on 04/27/2015    . valACYclovir (VALTREX) 500 MG tablet Take 1 tablet (500 mg total) by mouth 2 (two) times daily. 30 tablet 6  . Calcium Carbonate-Vitamin D (CALCIUM + D PO) Take 1 tablet by mouth daily. Reported on 04/27/2015    . Glucosamine-Chondroitin (GLUCOSAMINE CHONDR COMPLEX PO) Take 1 tablet by mouth daily. Reported on 04/27/2015    . loratadine (CLARITIN) 10 MG tablet Take 10 mg by mouth daily as needed for allergies or rhinitis. Reported on 04/27/2015    . orlistat (XENICAL) 120 MG capsule Take 1 capsule (120 mg total) by mouth 3 (three) times daily with meals. (Patient not taking: Reported on 08/24/2015) 90 capsule 1  . Probiotic Product (ALIGN PO) Take by mouth.    . traMADol (ULTRAM) 50 MG tablet Take 1 tablet (50 mg total) by mouth every  8 (eight) hours as needed. (Patient not taking: Reported on 08/24/2015) 30 tablet 0   No current facility-administered medications on file prior to visit.     Allergies  Allergen Reactions  . Codeine Nausea And Vomiting  . Other Diarrhea and Nausea And Vomiting    MSG    Family History  Problem Relation Age of Onset  . Breast cancer Mother 70    Deceased  . Stroke Mother   . Hypertension Mother   . Ovarian cancer Sister   . Renal cancer Father   . Lung cancer Father 24    Deceased  . Melanoma Father   .  Heart disease Maternal Grandmother   . Stomach cancer Paternal Grandfather   . Cancer - Other Maternal Aunt     Pancreatic  . Diabetes Brother   . Heart disease Maternal Aunt   . Heart attack Maternal Aunt   . Melanoma Paternal Aunt   . Multiple myeloma Paternal Aunt   . Brain cancer Brother     G-Blastoma  . Lung cancer Brother   . Lung cancer Maternal Aunt   . Colon cancer Neg Hx   . Esophageal cancer Neg Hx   . Rectal cancer Cousin     Social History   Social History  . Marital status: Single    Spouse name: N/A  . Number of children: N/A  . Years of education: N/A   Social History Main Topics  . Smoking status: Former Research scientist (life sciences)  . Smokeless tobacco: Never Used     Comment: Quit >30 yrs  . Alcohol use 0.0 oz/week     Comment: social  . Drug use: No  . Sexual activity: Not Currently   Other Topics Concern  . Not on file   Social History Narrative  . No narrative on file   Review of Systems - See HPI.  All other ROS are negative.  BP 122/84 (BP Location: Right Arm, Patient Position: Sitting, Cuff Size: Large)   Pulse 67   Temp 97.8 F (36.6 C) (Oral)   Resp 16   Ht 5' 4"  (1.626 m)   Wt 214 lb (97.1 kg)   SpO2 97%   BMI 36.73 kg/m   Physical Exam  Constitutional: She is oriented to person, place, and time and well-developed, well-nourished, and in no distress.  HENT:  Head: Normocephalic and atraumatic.  Mouth/Throat: Oropharynx is clear and moist. No oropharyngeal exudate.  Eyes: Conjunctivae are normal. Pupils are equal, round, and reactive to light.  Neck: Neck supple. No thyromegaly present.  Cardiovascular: Normal rate, regular rhythm, normal heart sounds and intact distal pulses.   Pulses:      Popliteal pulses are 2+ on the right side, and 2+ on the left side.       Dorsalis pedis pulses are 2+ on the right side, and 2+ on the left side.       Posterior tibial pulses are 2+ on the right side, and 2+ on the left side.  Pulmonary/Chest: Effort  normal and breath sounds normal. No respiratory distress. She has no wheezes. She has no rales. She exhibits no tenderness.  Musculoskeletal: Normal range of motion.       Right upper leg: She exhibits no tenderness, no bony tenderness, no swelling and no edema.       Left upper leg: She exhibits no tenderness, no bony tenderness, no swelling and no edema.       Right lower leg: She exhibits no tenderness, no  bony tenderness, no swelling and no edema.       Left lower leg: She exhibits no tenderness, no bony tenderness, no swelling and no edema.  Neurological: She is alert and oriented to person, place, and time. No cranial nerve deficit.  Skin: Skin is warm and dry.  Psychiatric: Affect normal.  Vitals reviewed.   Recent Results (from the past 2160 hour(s))  POCT Urinalysis Dipstick     Status: Abnormal   Collection Time: 07/29/15 12:53 PM  Result Value Ref Range   Color, UA     Clarity, UA clear    Glucose, UA 500    Bilirubin, UA     Ketones, UA trace    Spec Grav, UA 1.020    Blood, UA     pH, UA     Protein, UA trace    Urobilinogen, UA negative    Nitrite, UA negative    Leukocytes, UA Negative Negative  CULTURE, URINE COMPREHENSIVE     Status: None   Collection Time: 07/29/15  1:23 PM  Result Value Ref Range   Colony Count 50,000 COLONIES/ML    Organism ID, Bacteria VIRIDANS STREPTOCOCCUS    Organism ID, Bacteria DIPTHEROIDS (CORYNEBACTERIUM SPECIES)     Comment: Standardized susceptibility testing for this organism is not available.     Assessment/Plan: 1. Palpitations Sounds like PVC. EKG with NSR. Will check CBC, CMP, Mag, TSH. Giving her SOB with exertion and intermittent episodes of chest pain, will obtain Lexiscan and Echo. Supportive measures reviewed with patient. She will cut down on caffeine and avoid alcohol consumption. Alarm signs/symptoms reviewed with patient that would prompt ER assessment. - EKG 12-Lead - CBC - Comp Met (CMET) - Magnesium - TSH -  Myocardial Perfusion Imaging; Future - ECHOCARDIOGRAM COMPLETE; Future - CULTURE, URINE COMPREHENSIVE  2. SOB (shortness of breath) on exertion Lungs CTAB. No SOB at present. Only with exertion. Vitals Stable. Will check labs today and Echocardiogram. As above, alarm signs/symptoms reviewed that would prompt ER assessment. - EKG 12-Lead - CBC - TSH - Myocardial Perfusion Imaging; Future - ECHOCARDIOGRAM COMPLETE; Future - CULTURE, URINE COMPREHENSIVE  3. Bilateral leg pain Not reproducible with exam. Myalgias mostly. Will check electrolytes and CRP. Start B complex vitamin, MTV. Stretches reviewed.  - Comp Met (CMET) - CRP High sensitivity    Leeanne Rio, [place]

## 2015-08-24 NOTE — Patient Instructions (Signed)
Please go to the lab for blood work. I will also send your urine off to culture. I will call with all results.  You will be contacted for a stress test and an echocardiogram of your heart. If you notice any recurrence of chest pain with the shortness of breath or palpitation, call 911 or go to the ER immediately.   Please continue chronic medications as directed. Cut back on your caffeine. Stay well hydrated. Start a B Complex multivitamin.  I have sent in a steroid cream to apply to the arms twice daily over the next 1-2 week. Call if symptoms are not improving.

## 2015-08-25 ENCOUNTER — Other Ambulatory Visit: Payer: Self-pay | Admitting: Physician Assistant

## 2015-08-25 DIAGNOSIS — E876 Hypokalemia: Secondary | ICD-10-CM

## 2015-08-25 MED ORDER — POTASSIUM CHLORIDE CRYS ER 20 MEQ PO TBCR: EXTENDED_RELEASE_TABLET | ORAL | 0 refills | Status: DC

## 2015-08-27 LAB — CULTURE, URINE COMPREHENSIVE

## 2015-09-01 ENCOUNTER — Other Ambulatory Visit (HOSPITAL_COMMUNITY)
Admission: RE | Admit: 2015-09-01 | Discharge: 2015-09-01 | Disposition: A | Discharge status: Home / Self Care | Payer: BLUE CROSS/BLUE SHIELD | Source: Ambulatory Visit | Attending: Physician Assistant | Admitting: Physician Assistant

## 2015-09-01 ENCOUNTER — Encounter: Payer: Self-pay | Admitting: Physician Assistant

## 2015-09-01 ENCOUNTER — Ambulatory Visit (INDEPENDENT_AMBULATORY_CARE_PROVIDER_SITE_OTHER): Payer: BLUE CROSS/BLUE SHIELD | Admitting: Physician Assistant

## 2015-09-01 VITALS — BP 108/66 | HR 63 | Temp 98.0°F | Resp 16 | Ht 64.0 in | Wt 215.4 lb

## 2015-09-01 DIAGNOSIS — N760 Acute vaginitis: Secondary | ICD-10-CM | POA: Diagnosis present

## 2015-09-01 DIAGNOSIS — R8290 Unspecified abnormal findings in urine: Secondary | ICD-10-CM

## 2015-09-01 DIAGNOSIS — N3000 Acute cystitis without hematuria: Secondary | ICD-10-CM | POA: Diagnosis not present

## 2015-09-01 DIAGNOSIS — R250 Abnormal head movements: Secondary | ICD-10-CM

## 2015-09-01 MED ORDER — CEFDINIR 300 MG PO CAPS: 300.0000 mg | ORAL_CAPSULE | Freq: Two times a day (BID) | ORAL | 0 refills | Status: DC

## 2015-09-01 NOTE — Progress Notes (Signed)
  Patient presents to clinic today c/o waking up in the middle of the night 3 days ago with her lips trembling and feeling that her head was shaking. States she was awake for the entire episode. Denies AMS, vision changes, aphasia. States she got out of bed and felt fine. Denies recurrence since then. Denies change in medications. Denies history of seizure. 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 Is wondering if her symptoms could be an allergic reaction.  Past Medical History:  Diagnosis Date  . Allergy    seasonal  . Arthritis   . Cataract    one removed, left still there  . Duodenal ulcer   . Environmental allergies   . Frequent headaches   . Hay fever   . Herpes simplex of female genitalia   . History of chicken pox   . Hypertension   . Migraine   . Osteoporosis   . Seasonal allergies     Current Outpatient Prescriptions on File Prior to Visit  Medication Sig Dispense Refill  . albuterol (PROVENTIL HFA;VENTOLIN HFA) 108 (90 BASE) MCG/ACT inhaler Inhale 1-2 puffs into the lungs every 6 (six) hours as needed for wheezing or shortness of breath. 1 Inhaler 2  . atenolol (TENORMIN) 25 MG tablet TAKE 1 TABLET (25 MG TOTAL) BY MOUTH DAILY. 90 tablet 1  . diphenhydrAMINE (BENADRYL) 25 MG tablet Take 25 mg by mouth every 6 (six) hours as needed for allergies. Reported on 04/27/2015    . fluticasone (FLONASE) 50 MCG/ACT nasal spray Place 2 sprays into both nostrils daily as needed for allergies or rhinitis. 16 g 5  . potassium chloride SA (K-DUR,KLOR-CON) 20 MEQ tablet Take 1 tablet twice a day for 1 week, then 1 by mouth daily (Patient taking differently: Take 20 mEq by mouth daily. Take 1 tablet twice a day for 1 week, then 1 by mouth daily) 60 tablet 0  . Propylene Glycol (SYSTANE BALANCE) 0.6 % SOLN Apply to eye daily as needed (Allergic Rhinitis). Reported on 04/27/2015    . valACYclovir (VALTREX) 500 MG tablet Take 1  tablet (500 mg total) by mouth 2 (two) times daily. (Patient taking differently: Take 500 mg by mouth 2 (two) times daily as needed. ) 30 tablet 6  . Calcium Carbonate-Vitamin D (CALCIUM + D PO) Take 1 tablet by mouth daily. Reported on 04/27/2015    . Glucosamine-Chondroitin (GLUCOSAMINE CHONDR COMPLEX PO) Take 1 tablet by mouth daily. Reported on 04/27/2015    . ibuprofen (ADVIL,MOTRIN) 200 MG tablet Take 200 mg by mouth every 6 (six) hours as needed for headache or moderate pain. Reported on 04/27/2015    . loratadine (CLARITIN) 10 MG tablet Take 10 mg by mouth daily as needed for allergies or rhinitis. Reported on 04/27/2015    . Probiotic Product (ALIGN PO) Take by mouth.     No current facility-administered medications on file prior to visit.     Allergies  Allergen Reactions  . Codeine Nausea And Vomiting  . Other Diarrhea and Nausea And Vomiting    MSG    Family History  Problem Relation Age of Onset  . Breast cancer Mother 73    Deceased  . Stroke Mother   . Hypertension Mother   . Ovarian cancer Sister   . Renal cancer Father   . Lung cancer Father 50    Deceased  . Melanoma Father   . Heart disease Maternal   Grandmother   . Stomach cancer Paternal Grandfather   . Cancer - Other Maternal Aunt     Pancreatic  . Diabetes Brother   . Heart disease Maternal Aunt   . Heart attack Maternal Aunt   . Melanoma Paternal Aunt   . Multiple myeloma Paternal Aunt   . Brain cancer Brother     G-Blastoma  . Lung cancer Brother   . Lung cancer Maternal Aunt   . Colon cancer Neg Hx   . Esophageal cancer Neg Hx   . Rectal cancer Cousin     Social History   Social History  . Marital status: Single    Spouse name: N/A  . Number of children: N/A  . Years of education: N/A   Social History Main Topics  . Smoking status: Former Research scientist (life sciences)  . Smokeless tobacco: Never Used     Comment: Quit >30 yrs  . Alcohol use 0.0 oz/week     Comment: social  . Drug use: No  . Sexual activity:  Not Currently   Other Topics Concern  . None   Social History Narrative  . None   Review of Systems - See HPI.  All other ROS are negative.  BP 108/66 (BP Location: Right Arm, Patient Position: Sitting, Cuff Size: Large)   Pulse 63   Temp 98 F (36.7 C) (Oral)   Resp 16   Ht 5' 4" (1.626 m)   Wt 215 lb 6 oz (97.7 kg)   SpO2 98%   BMI 36.97 kg/m   Physical Exam  Constitutional: She is oriented to person, place, and time and well-developed, well-nourished, and in no distress.  HENT:  Head: Normocephalic and atraumatic.  Right Ear: External ear normal.  Left Ear: External ear normal.  Nose: Nose normal.  Mouth/Throat: Oropharynx is clear and moist. No oropharyngeal exudate.  TM within normal limits bilaterally.  Eyes: Conjunctivae and EOM are normal. Pupils are equal, round, and reactive to light.  Neck: Neck supple.  Cardiovascular: Normal rate, regular rhythm, normal heart sounds and intact distal pulses.   Pulmonary/Chest: Effort normal and breath sounds normal. No respiratory distress. She has no wheezes. She has no rales. She exhibits no tenderness.  Lymphadenopathy:    She has no cervical adenopathy.  Neurological: She is alert and oriented to person, place, and time. She has normal sensation, normal reflexes and intact cranial nerves. No cranial nerve deficit. She has a normal Cerebellar Exam. Gait normal.  Skin: Skin is warm. No rash noted.  Psychiatric: Affect normal.  Vitals reviewed.   Recent Results (from the past 2160 hour(s))  POCT Urinalysis Dipstick     Status: Abnormal   Collection Time: 07/29/15 12:53 PM  Result Value Ref Range   Color, UA     Clarity, UA clear    Glucose, UA 500    Bilirubin, UA     Ketones, UA trace    Spec Grav, UA 1.020    Blood, UA     pH, UA     Protein, UA trace    Urobilinogen, UA negative    Nitrite, UA negative    Leukocytes, UA Negative Negative  CULTURE, URINE COMPREHENSIVE     Status: None   Collection Time:  07/29/15  1:23 PM  Result Value Ref Range   Colony Count 50,000 COLONIES/ML    Organism ID, Bacteria VIRIDANS STREPTOCOCCUS    Organism ID, Bacteria DIPTHEROIDS (CORYNEBACTERIUM SPECIES)     Comment: Standardized susceptibility testing for this organism is  not available.   CBC     Status: None   Collection Time: 08/24/15 12:25 PM  Result Value Ref Range   WBC 6.1 4.0 - 10.5 K/uL   RBC 4.28 3.87 - 5.11 Mil/uL   Platelets 272.0 150.0 - 400.0 K/uL   Hemoglobin 13.4 12.0 - 15.0 g/dL   HCT 39.9 36.0 - 46.0 %   MCV 93.3 78.0 - 100.0 fl   MCHC 33.7 30.0 - 36.0 g/dL   RDW 13.3 11.5 - 15.5 %  Comp Met (CMET)     Status: Abnormal   Collection Time: 08/24/15 12:25 PM  Result Value Ref Range   Sodium 141 135 - 145 mEq/L   Potassium 3.3 (L) 3.5 - 5.1 mEq/L   Chloride 104 96 - 112 mEq/L   CO2 32 19 - 32 mEq/L   Glucose, Bld 93 70 - 99 mg/dL   BUN 13 6 - 23 mg/dL   Creatinine, Ser 0.84 0.40 - 1.20 mg/dL   Total Bilirubin 0.5 0.2 - 1.2 mg/dL   Alkaline Phosphatase 56 39 - 117 U/L   AST 16 0 - 37 U/L   ALT 21 0 - 35 U/L   Total Protein 6.2 6.0 - 8.3 g/dL   Albumin 4.0 3.5 - 5.2 g/dL   Calcium 9.5 8.4 - 10.5 mg/dL   GFR 72.57 >60.00 mL/min  Magnesium     Status: None   Collection Time: 08/24/15 12:25 PM  Result Value Ref Range   Magnesium 2.2 1.5 - 2.5 mg/dL  TSH     Status: None   Collection Time: 08/24/15 12:25 PM  Result Value Ref Range   TSH 0.78 0.35 - 4.50 uIU/mL  CRP High sensitivity     Status: None   Collection Time: 08/24/15 12:25 PM  Result Value Ref Range   CRP, High Sensitivity 3.030 0.000 - 5.000 mg/L    Comment: Note:  An elevated hs-CRP (>5 mg/L) should be repeated after 2 weeks to rule out recent infection or trauma.  CULTURE, URINE COMPREHENSIVE     Status: None   Collection Time: 08/24/15 12:25 PM  Result Value Ref Range   Culture ESCHERICHIA COLI ENTEROCOCCUS SPECIES     Colony Count 1,000-10,000 CFU/mL    Organism ID, Bacteria ESCHERICHIA COLI    Colony  Count 10,000-50,000 CFU/mL    Organism ID, Bacteria ENTEROCOCCUS SPECIES       Susceptibility   Escherichia coli -  (no method available)    AMPICILLIN >=32 Resistant     AMOX/CLAVULANIC 16 Intermediate     AMPICILLIN/SULBACTAM >=32 Resistant     PIP/TAZO <=4 Sensitive     IMIPENEM <=0.25 Sensitive     CEFAZOLIN 16 Resistant     CEFTRIAXONE <=1 Sensitive     CEFTAZIDIME <=1 Sensitive     CEFEPIME <=1 Sensitive     GENTAMICIN <=1 Sensitive     TOBRAMYCIN <=1 Sensitive     CIPROFLOXACIN <=0.25 Sensitive     LEVOFLOXACIN <=0.12 Sensitive     NITROFURANTOIN <=16 Sensitive     TRIMETH/SULFA* <=20 Sensitive      * NR=NOT REPORTABLE,SEE COMMENTORAL therapy:A cefazolin MIC of <32 predicts susceptibility to the oral agents cefaclor,cefdinir,cefpodoxime,cefprozil,cefuroxime,cephalexin,and loracarbef when used for therapy of uncomplicated UTIs due to E.coli,K.pneumomiae,and P.mirabilis. PARENTERAL therapy: A cefazolinMIC of >8 indicates resistance to parenteralcefazolin. An alternate test method must beperformed to confirm susceptibility to parenteralcefazolin.   Enterococcus species -  (no method available)    AMPICILLIN <=2 Sensitive     LEVOFLOXACIN  1 Sensitive     NITROFURANTOIN <=16 Sensitive     VANCOMYCIN 2 Sensitive     TETRACYCLINE >=16 Resistant     Assessment/Plan: 1. Foul smelling urine Will add-on urine ancillary to assess. Patient being treated for UTI at present. - Urine cytology ancillary only  2. Acute cystitis without hematuria Patient endorses never receiving medication. Medication resent. Supportive measures reviewed. - cefdinir (OMNICEF) 300 MG capsule; Take 1 capsule (300 mg total) by mouth 2 (two) times daily.  Dispense: 14 capsule; Refill: 0  3. Abnormal head movements Unclear etiology. Patient was alert during episode so seizure unlikely. Potential pseudoseziure versus parasomnia? Examination within normal limits. No recurrent episodes. Will refer to Neurology for  assessment and potential EEG. ER if any symptoms recur. _0 - Ambulatory referral to Neurology   Leeanne Rio, PA-C

## 2015-09-01 NOTE — Patient Instructions (Signed)
I will call you with your results. Please take the antibiotic as directed.  You will be contacted for an assessment by Neurology.  If you note any recurrence of symptoms, please call 911 or have someone take you to the ER.

## 2015-09-04 LAB — URINE CYTOLOGY ANCILLARY ONLY
BACTERIAL VAGINITIS: NEGATIVE
Candida vaginitis: NEGATIVE

## 2015-09-08 ENCOUNTER — Ambulatory Visit (INDEPENDENT_AMBULATORY_CARE_PROVIDER_SITE_OTHER): Payer: BLUE CROSS/BLUE SHIELD | Admitting: Neurology

## 2015-09-08 ENCOUNTER — Encounter: Payer: Self-pay | Admitting: Neurology

## 2015-09-08 ENCOUNTER — Other Ambulatory Visit: Payer: BLUE CROSS/BLUE SHIELD

## 2015-09-08 VITALS — BP 139/89 | HR 63 | Ht 64.0 in | Wt 214.0 lb

## 2015-09-08 DIAGNOSIS — E876 Hypokalemia: Secondary | ICD-10-CM

## 2015-09-08 DIAGNOSIS — G44221 Chronic tension-type headache, intractable: Secondary | ICD-10-CM | POA: Diagnosis not present

## 2015-09-08 DIAGNOSIS — R51 Headache: Secondary | ICD-10-CM

## 2015-09-08 DIAGNOSIS — G250 Essential tremor: Secondary | ICD-10-CM

## 2015-09-08 DIAGNOSIS — R519 Headache, unspecified: Secondary | ICD-10-CM | POA: Insufficient documentation

## 2015-09-08 DIAGNOSIS — R5382 Chronic fatigue, unspecified: Secondary | ICD-10-CM

## 2015-09-08 NOTE — [place] Notes (Signed)
Reason for visit: Head tremor  Referring physician: [name] is a 65 y.o. female  History of present illness:  Briana Hull is a 65 year old right-handed white female with a history of an event that occurred on 08/29/2015. The patient was dog sitting at the house of her boss. She indicates that she woke up on her right side with her head moving involuntarily side to side. The patient rolled over on her back, and the movement stopped. The patient does not recall any jerking or twitching of either arm or leg. The patient does not recall whether or not she was dreaming at the time that she woke up. The patient has not had any recurrence previously or since. She goes on to say that she has had other issues. Six months ago, she began having significant problems with fatigue. The fatigue has gotten quite significant, she feels exhausted all times. She is not sure whether or not she snores at night. The patient has also developed daily headaches that began about 2 months ago. She also has chronic neck and shoulder discomfort, headaches in the back of the head associated with a pressure sensation and a dull achy pain. The patient does not take medication for the headache and the headache does not prevent her from performing day to day activities. The patient denies any focal numbness or weakness of the face, arms, or legs. She denies any photophobia or phonophobia with the headache or any nausea or vomiting. She does report chronic problems with insomnia. She gets up at least twice a night to go to the bathroom as well. The patient does report neck stiffness. She has extreme sensitivity to certain odors such as perfumes or the smell of new tires. These odors will bring on headache. The patient is sent to this office for an evaluation.  Past Medical History:  Diagnosis Date  . Allergy    seasonal  . Arthritis   . Cataract    one removed, left still there  . Duodenal ulcer   .  Environmental allergies   . Frequent headaches   . Hay fever   . Herpes simplex of female genitalia   . History of chicken pox   . Hypertension   . Migraine   . Osteoporosis   . Seasonal allergies     Past Surgical History:  Procedure Laterality Date  . LIPOSUCTION    . ROOT CANAL    . TONSILLECTOMY  1978  . TOOTH EXTRACTION      Family History  Problem Relation Age of Onset  . Breast cancer Mother 55    Deceased  . Stroke Mother   . Hypertension Mother   . Ovarian cancer Sister   . Renal cancer Father   . Lung cancer Father 73    Deceased  . Melanoma Father   . Heart disease Maternal Grandmother   . Stomach cancer Paternal Grandfather   . Cancer - Other Maternal Aunt     Pancreatic  . Diabetes Brother   . Heart disease Maternal Aunt   . Heart attack Maternal Aunt   . Melanoma Paternal Aunt   . Multiple myeloma Paternal Aunt   . Brain cancer Brother     G-Blastoma  . Lung cancer Brother   . Lung cancer Maternal Aunt   . Rectal cancer Cousin   . Colon cancer Neg Hx   . Esophageal cancer Neg Hx     Social history:  reports that she quit  smoking about 34 years ago. She has never used smokeless tobacco. She reports that she drinks alcohol. She reports that she does not use drugs.  Medications:  Prior to Admission medications   Medication Sig Start Date End Date Taking? Authorizing Provider  atenolol (TENORMIN) 25 MG tablet TAKE 1 TABLET (25 MG TOTAL) BY MOUTH DAILY. 05/26/15  Yes Briana Jeans, PA-C  potassium chloride SA (K-DUR,KLOR-CON) 20 MEQ tablet Take 1 tablet twice a day for 1 week, then 1 by mouth daily Patient taking differently: Take 20 mEq by mouth daily. Take 1 tablet twice a day for 1 week, then 1 by mouth daily 08/25/15  Yes Briana Jeans, PA-C  Propylene Glycol (SYSTANE BALANCE) 0.6 % SOLN Apply to eye daily as needed (Allergic Rhinitis). Reported on 04/27/2015   Yes Historical Provider, MD  albuterol (PROVENTIL HFA;VENTOLIN HFA) 108 (90 BASE)  MCG/ACT inhaler Inhale 1-2 puffs into the lungs every 6 (six) hours as needed for wheezing or shortness of breath. Patient not taking: Reported on 09/08/2015 12/05/14   Briana Jeans, PA-C  Calcium Carbonate-Vitamin D (CALCIUM + D PO) Take 1 tablet by mouth daily. Reported on 04/27/2015    Historical Provider, MD  diphenhydrAMINE (BENADRYL) 25 MG tablet Take 25 mg by mouth every 6 (six) hours as needed for allergies. Reported on 04/27/2015    Historical Provider, MD  fluticasone (FLONASE) 50 MCG/ACT nasal spray Place 2 sprays into both nostrils daily as needed for allergies or rhinitis. Patient not taking: Reported on 09/08/2015 12/03/14   Briana Jeans, PA-C  Glucosamine-Chondroitin (GLUCOSAMINE CHONDR COMPLEX PO) Take 1 tablet by mouth daily. Reported on 04/27/2015    Historical Provider, MD  ibuprofen (ADVIL,MOTRIN) 200 MG tablet Take 200 mg by mouth every 6 (six) hours as needed for headache or moderate pain. Reported on 04/27/2015    Historical Provider, MD  loratadine (CLARITIN) 10 MG tablet Take 10 mg by mouth daily as needed for allergies or rhinitis. Reported on 04/27/2015    Historical Provider, MD  Probiotic Product (ALIGN PO) Take by mouth.    Historical Provider, MD  valACYclovir (VALTREX) 500 MG tablet Take 1 tablet (500 mg total) by mouth 2 (two) times daily. Patient not taking: Reported on 09/08/2015 04/27/15   Lavonia Drafts, MD      Allergies  Allergen Reactions  . Codeine Nausea And Vomiting  . Other Diarrhea and Nausea And Vomiting    MSG    ROS:  Out of a complete 14 system review of symptoms, the patient complains only of the following symptoms, and all other reviewed systems are negative.  Weight gain, fatigue Chest pain Ringing in the ears Skin rash, itching, moles Incontinence of the bowel and bladder, urination problems Feeling hot Joint pain, achy muscles Allergies, runny nose Headache, numbness, weakness Not enough sleep Insomnia  Blood pressure  139/89, pulse 63, height 5' 4" (1.626 m), weight 214 lb (97.1 kg).  Physical Exam  General: The patient is alert and cooperative at the time of the examination. The patient is moderately obese.  Eyes: Pupils are equal, round, and reactive to light. Discs are flat bilaterally.  Neck: The neck is supple, no carotid bruits are noted.  Respiratory: The respiratory examination is clear.  Cardiovascular: The cardiovascular examination reveals a regular rate and rhythm, no obvious murmurs or rubs are noted.  Skin: Extremities are without significant edema.  Neurologic Exam  Mental status: The patient is alert and oriented x 3 at the time of the  examination. The patient has apparent normal recent and remote memory, with an apparently normal attention span and concentration ability.  Cranial nerves: Facial symmetry is present. There is good sensation of the face to pinprick and soft touch bilaterally. The strength of the facial muscles and the muscles to head turning and shoulder shrug are normal bilaterally. Speech is well enunciated, no aphasia or dysarthria is noted. Extraocular movements are full. Visual fields are full. The tongue is midline, and the patient has symmetric elevation of the soft palate. No obvious hearing deficits are noted.  Motor: The motor testing reveals 5 over 5 strength of all 4 extremities. Good symmetric motor tone is noted throughout.  Sensory: Sensory testing is intact to pinprick, soft touch, vibration sensation, and position sense on all 4 extremities. No evidence of extinction is noted.  Coordination: Cerebellar testing reveals good finger-nose-finger and heel-to-shin bilaterally.  Gait and station: Gait is normal. Tandem gait is normal. Romberg is negative. No drift is seen.  Reflexes: Deep tendon reflexes are symmetric and normal bilaterally. Toes are downgoing bilaterally.   Assessment/Plan:  1. Transient episode of head tremor  2. Daily headache,  possibly cervicogenic headache  3. Chronic fatigue  The etiology of the head tremor is not clear. This likely is a benign event. The patient however is reporting daily headaches for 2 months that may represent a cervicogenic headache. The patient has chronic issues with neck and shoulder discomfort. The patient also reports chronic fatigue. She will be sent for blood work today, MRI of the brain will be done. The patient will have a sleep deprived EEG study. The patient does not wish to go on medications for her headache. I will contact her with results of the above studies, she will follow-up if needed through this office.  Jill Alexanders MD 09/08/2015 9:42 AM  [facility] [address] [place], [place] [address]  Phone [phone] Fax [phone]

## 2015-09-09 LAB — SEDIMENTATION RATE: SED RATE: 2 mm/h (ref 0–40)

## 2015-09-09 LAB — COMPREHENSIVE METABOLIC PANEL
A/G RATIO: 2 (ref 1.2–2.2)
ALT: 18 IU/L (ref 0–32)
AST: 18 IU/L (ref 0–40)
Albumin: 4.5 g/dL (ref 3.6–4.8)
Alkaline Phosphatase: 68 IU/L (ref 39–117)
BILIRUBIN TOTAL: 0.6 mg/dL (ref 0.0–1.2)
BUN/Creatinine Ratio: 14 (ref 12–28)
BUN: 12 mg/dL (ref 8–27)
CO2: 22 mmol/L (ref 18–29)
Calcium: 9.5 mg/dL (ref 8.7–10.3)
Chloride: 104 mmol/L (ref 96–106)
Creatinine, Ser: 0.88 mg/dL (ref 0.57–1.00)
GFR calc Af Amer: 81 mL/min/{1.73_m2} (ref >59)
GFR calc non Af Amer: 70 mL/min/{1.73_m2} (ref >59)
GLOBULIN, TOTAL: 2.2 g/dL (ref 1.5–4.5)
Glucose: 109 mg/dL — ABNORMAL HIGH (ref 65–99)
POTASSIUM: 5 mmol/L (ref 3.5–5.2)
SODIUM: 142 mmol/L (ref 134–144)
Total Protein: 6.7 g/dL (ref 6.0–8.5)

## 2015-09-09 LAB — VITAMIN D 25 HYDROXY (VIT D DEFICIENCY, FRACTURES): VIT D 25 HYDROXY: 20.5 ng/mL — AB (ref 30.0–100.0)

## 2015-09-09 LAB — VITAMIN B12: Vitamin B-12: 261 pg/mL (ref 211–946)

## 2015-09-28 ENCOUNTER — Other Ambulatory Visit: Payer: BLUE CROSS/BLUE SHIELD

## 2015-09-30 ENCOUNTER — Other Ambulatory Visit: Payer: Self-pay | Admitting: Physician Assistant

## 2015-09-30 MED ORDER — POTASSIUM CHLORIDE CRYS ER 20 MEQ PO TBCR: EXTENDED_RELEASE_TABLET | ORAL | 3 refills | Status: DC

## 2015-10-20 ENCOUNTER — Telehealth (HOSPITAL_COMMUNITY): Payer: Self-pay | Admitting: Physician Assistant

## 2015-10-20 NOTE — Telephone Encounter (Signed)
Called Dr. Earlie Server office and spoke with Delsa Sale and expressed to her that a few attempts were made to reach out to the patient to schedule her for the Myoview and Echo that he ordered back in July. Delsa Sale said she would notify the doctor.

## 2015-10-29 ENCOUNTER — Encounter: Payer: Self-pay | Admitting: Family Medicine

## 2015-10-29 ENCOUNTER — Ambulatory Visit (INDEPENDENT_AMBULATORY_CARE_PROVIDER_SITE_OTHER): Payer: BLUE CROSS/BLUE SHIELD | Admitting: Family Medicine

## 2015-10-29 VITALS — BP 120/70 | HR 79 | Temp 99.2°F | Ht 64.0 in | Wt 216.4 lb

## 2015-10-29 DIAGNOSIS — J020 Streptococcal pharyngitis: Secondary | ICD-10-CM | POA: Diagnosis not present

## 2015-10-29 LAB — POCT RAPID STREP A (OFFICE): Rapid Strep A Screen: POSITIVE — AB

## 2015-10-29 MED ORDER — AMOXICILLIN 500 MG PO CAPS: 500.0000 mg | ORAL_CAPSULE | Freq: Three times a day (TID) | ORAL | 0 refills | Status: DC

## 2015-10-29 NOTE — Progress Notes (Signed)
Pre visit review using our clinic review tool, if applicable. No additional management support is needed unless otherwise documented below in the visit note. 

## 2015-10-29 NOTE — [place] Notes (Signed)
SUBJECTIVE:   Briana Hull is a 65 y.o. female presents to the clinic for:  Chief Complaint  Patient presents with  . Sore Throat    x 1 week ago,along with nasal dip,and cough(product-light yellow) started today    Complains of sore throat for 5 days.  Other associated symptoms: ST, PND.  Denies: fevers, cough, SOB, ear pain/drainage Sick Contacts: none Therapy to date: none  History  Smoking Status  . Former Smoker  . Quit date: 09/07/1981  Smokeless Tobacco  . Never Used    Comment: Quit >30 yrs    ROS: Pertinent items are noted in HPI  Patient's medications, allergies, past medical, surgical, social and family histories were reviewed and updated as appropriate.  OBJECTIVE:  BP 120/70 (BP Location: Left Arm, Patient Position: Sitting, Cuff Size: Large)   Pulse 79   Temp 99.2 F (37.3 C) (Oral)   Ht 5\' 4"  (1.626 m)   Wt 216 lb 6.4 oz (98.2 kg)   SpO2 98%   BMI 37.14 kg/m  General: Awake, alert, appearing stated age Eyes: conjunctivae and sclerae clear Ears: normal TMs bilaterally Nose: no visible exudate Oropharynx: Pharynx erythematous without visible exudates Neck: supple, no significant adenopathy Lungs: clear to auscultation, no wheezes, rales or rhonchi, symmetric air entry, normal effort Heart: rate and rhythm regular Skin:reveals no rash Psych: Age appropriate judgment and insight  ASSESSMENT/PLAN:  Strep throat - Plan: POCT rapid strep A, amoxicillin (AMOXIL) 500 MG capsule  Orders as above. Throw out toothbrush after 24 hours of abx.  F/u prn. Pt voiced understanding and agreement to the plan.  [place], DO 10/29/15 4:04 PM

## 2015-10-29 NOTE — Patient Instructions (Addendum)
Throw out your toothbrush 24 hours after starting antibiotic. Strep Throat Strep throat is a bacterial infection of the throat. Your health care provider may call the infection tonsillitis or pharyngitis, depending on whether there is swelling in the tonsils or at the back of the throat. Strep throat is most common during the cold months of the year in children who are 77-64 years of age, but it can happen during any season in people of any age. This infection is spread from person to person (contagious) through coughing, sneezing, or close contact. CAUSES Strep throat is caused by the bacteria called Streptococcus pyogenes. RISK FACTORS This condition is more likely to develop in:  People who spend time in crowded places where the infection can spread easily.  People who have close contact with someone who has strep throat. SYMPTOMS Symptoms of this condition include:  Fever or chills.   Redness, swelling, or pain in the tonsils or throat.  Pain or difficulty when swallowing.  White or yellow spots on the tonsils or throat.  Swollen, tender glands in the neck or under the jaw.  Red rash all over the body (rare). DIAGNOSIS This condition is diagnosed by performing a rapid strep test or by taking a swab of your throat (throat culture test). Results from a rapid strep test are usually ready in a few minutes, but throat culture test results are available after one or two days. TREATMENT This condition is treated with antibiotic medicine. HOME CARE INSTRUCTIONS Medicines  Take over-the-counter and prescription medicines only as told by your health care provider.  Take your antibiotic as told by your health care provider. Do not stop taking the antibiotic even if you start to feel better.  Have family members who also have a sore throat or fever tested for strep throat. They may need antibiotics if they have the strep infection. Eating and Drinking  Do not share food, drinking cups,  or personal items that could cause the infection to spread to other people.  If swallowing is difficult, try eating soft foods until your sore throat feels better.  Drink enough fluid to keep your urine clear or pale yellow. General Instructions  Gargle with a salt-water mixture 3-4 times per day or as needed. To make a salt-water mixture, completely dissolve -1 tsp of salt in 1 cup of warm water.  Make sure that all household members wash their hands well.  Get plenty of rest.  Stay home from school or work until you have been taking antibiotics for 24 hours.  Keep all follow-up visits as told by your health care provider. This is important. SEEK MEDICAL CARE IF:  The glands in your neck continue to get bigger.  You develop a rash, cough, or earache.  You cough up a thick liquid that is green, yellow-brown, or bloody.  You have pain or discomfort that does not get better with medicine.  Your problems seem to be getting worse rather than better.  You have a fever. SEEK IMMEDIATE MEDICAL CARE IF:  You have new symptoms, such as vomiting, severe headache, stiff or painful neck, chest pain, or shortness of breath.  You have severe throat pain, drooling, or changes in your voice.  You have swelling of the neck, or the skin on the neck becomes red and tender.  You have signs of dehydration, such as fatigue, dry mouth, and decreased urination.  You become increasingly sleepy, or you cannot wake up completely.  Your joints become red or painful.  This information is not intended to replace advice given to you by your health care provider. Make sure you discuss any questions you have with your health care provider.   Document Released: 01/15/2000 Document Revised: 10/08/2014 Document Reviewed: 05/12/2014 Elsevier Interactive Patient Education [employer].

## 2015-11-02 ENCOUNTER — Telehealth: Payer: Self-pay | Admitting: Physician Assistant

## 2015-11-02 NOTE — Telephone Encounter (Signed)
Pt would like to transfer care from Dr Hassell Done to Dr Silvio Pate due to location and being closer to home.  Please let me know if this is ok. Thank you

## 2015-11-02 NOTE — Telephone Encounter (Signed)
Okay Looks like she is due for a physical Please set her up in the next few months--I can meet her and do the physical (as long as she doesn't have acute issues that need to be handled)

## 2015-11-02 NOTE — Telephone Encounter (Signed)
Fine with me

## 2015-11-04 NOTE — Telephone Encounter (Signed)
Scheduled for 11/21.

## 2015-11-13 ENCOUNTER — Encounter: Payer: Self-pay | Admitting: Family Medicine

## 2015-11-18 ENCOUNTER — Encounter: Payer: Self-pay | Admitting: Family Medicine

## 2015-11-20 ENCOUNTER — Other Ambulatory Visit: Payer: Self-pay | Admitting: Physician Assistant

## 2015-11-23 NOTE — Telephone Encounter (Signed)
Refill sent per [facility] refill protocol/SLS  

## 2015-12-22 ENCOUNTER — Ambulatory Visit (INDEPENDENT_AMBULATORY_CARE_PROVIDER_SITE_OTHER)
Admission: RE | Admit: 2015-12-22 | Discharge: 2015-12-22 | Disposition: A | Discharge status: Home / Self Care | Payer: BLUE CROSS/BLUE SHIELD | Source: Ambulatory Visit | Attending: Internal Medicine | Admitting: Internal Medicine

## 2015-12-22 ENCOUNTER — Encounter: Payer: Self-pay | Admitting: Internal Medicine

## 2015-12-22 ENCOUNTER — Ambulatory Visit (INDEPENDENT_AMBULATORY_CARE_PROVIDER_SITE_OTHER): Payer: BLUE CROSS/BLUE SHIELD | Admitting: Internal Medicine

## 2015-12-22 VITALS — BP 130/88 | HR 97 | Temp 97.8°F | Ht 63.0 in | Wt 213.0 lb

## 2015-12-22 DIAGNOSIS — I10 Essential (primary) hypertension: Secondary | ICD-10-CM

## 2015-12-22 DIAGNOSIS — J4520 Mild intermittent asthma, uncomplicated: Secondary | ICD-10-CM

## 2015-12-22 DIAGNOSIS — J45909 Unspecified asthma, uncomplicated: Secondary | ICD-10-CM | POA: Insufficient documentation

## 2015-12-22 DIAGNOSIS — L989 Disorder of the skin and subcutaneous tissue, unspecified: Secondary | ICD-10-CM | POA: Insufficient documentation

## 2015-12-22 DIAGNOSIS — G479 Sleep disorder, unspecified: Secondary | ICD-10-CM

## 2015-12-22 DIAGNOSIS — K219 Gastro-esophageal reflux disease without esophagitis: Secondary | ICD-10-CM

## 2015-12-22 DIAGNOSIS — Z0000 Encounter for general adult medical examination without abnormal findings: Secondary | ICD-10-CM | POA: Diagnosis not present

## 2015-12-22 LAB — CBC WITH DIFFERENTIAL/PLATELET
BASOS ABS: 0 10*3/uL (ref 0.0–0.1)
Basophils Relative: 0.1 % (ref 0.0–3.0)
EOS ABS: 0.2 10*3/uL (ref 0.0–0.7)
EOS PCT: 2.6 % (ref 0.0–5.0)
HCT: 41.6 % (ref 36.0–46.0)
HEMOGLOBIN: 14.3 g/dL (ref 12.0–15.0)
LYMPHS ABS: 1.7 10*3/uL (ref 0.7–4.0)
Lymphocytes Relative: 28.5 % (ref 12.0–46.0)
MCHC: 34.4 g/dL (ref 30.0–36.0)
MCV: 93.7 fl (ref 78.0–100.0)
MONO ABS: 0.3 10*3/uL (ref 0.1–1.0)
Monocytes Relative: 5.3 % (ref 3.0–12.0)
NEUTROS PCT: 63.5 % (ref 43.0–77.0)
Neutro Abs: 3.9 10*3/uL (ref 1.4–7.7)
Platelets: 262 10*3/uL (ref 150.0–400.0)
RBC: 4.44 Mil/uL (ref 3.87–5.11)
RDW: 13.9 % (ref 11.5–15.5)
WBC: 6.1 10*3/uL (ref 4.0–10.5)

## 2015-12-22 LAB — LIPID PANEL
CHOLESTEROL: 169 mg/dL (ref 0–200)
HDL: 66.7 mg/dL (ref >39.00)
LDL Cholesterol: 81 mg/dL (ref 0–99)
NONHDL: 102.22
TRIGLYCERIDES: 104 mg/dL (ref 0.0–149.0)
Total CHOL/HDL Ratio: 3
VLDL: 20.8 mg/dL (ref 0.0–40.0)

## 2015-12-22 LAB — COMPREHENSIVE METABOLIC PANEL
ALBUMIN: 4.2 g/dL (ref 3.5–5.2)
ALK PHOS: 58 U/L (ref 39–117)
ALT: 22 U/L (ref 0–35)
AST: 16 U/L (ref 0–37)
BILIRUBIN TOTAL: 0.6 mg/dL (ref 0.2–1.2)
BUN: 13 mg/dL (ref 6–23)
CO2: 29 mEq/L (ref 19–32)
CREATININE: 0.9 mg/dL (ref 0.40–1.20)
Calcium: 9.7 mg/dL (ref 8.4–10.5)
Chloride: 106 mEq/L (ref 96–112)
GFR: 66.95 mL/min (ref >60.00)
GLUCOSE: 105 mg/dL — AB (ref 70–99)
POTASSIUM: 5 meq/L (ref 3.5–5.1)
SODIUM: 140 meq/L (ref 135–145)
TOTAL PROTEIN: 6.5 g/dL (ref 6.0–8.3)

## 2015-12-22 LAB — T4, FREE: FREE T4: 0.89 ng/dL (ref 0.60–1.60)

## 2015-12-22 MED ORDER — TRAZODONE HCL 50 MG PO TABS: 50.0000 mg | ORAL_TABLET | Freq: Every day | ORAL | 11 refills | Status: DC

## 2015-12-22 MED ORDER — MONTELUKAST SODIUM 10 MG PO TABS: 10.0000 mg | ORAL_TABLET | Freq: Every day | ORAL | 11 refills | Status: DC

## 2015-12-22 NOTE — Progress Notes (Signed)
Pre visit review using our clinic review tool, if applicable. No additional management support is needed unless otherwise documented below in the visit note. 

## 2015-12-22 NOTE — Assessment & Plan Note (Signed)
BP Readings from Last 3 Encounters:  12/22/15 130/88  10/29/15 120/70  09/08/15 139/89   acceptable

## 2015-12-22 NOTE — [place] Notes (Signed)
Subjective:    Patient ID: Briana Hull, female    DOB: Jan 30, 1951, 65 y.o.   MRN: [id number]  HPI Here to establish care and for physical Had been going to [place]--couldn't get in here Lives locally  Has "an issue with my lungs" Coughing up some stuff Recent severe strep throat Hasn't felt well since strep throat over 2 months ago No fever SOB--"goes along with my allergies" Not consistent with loratadine or flonase Uses albuterol inhaler at times also--not in past few weeks benedryl does help--actually keeps her up--but not recently  Past allergy shots Allergic asthma--- needs the inhaler occasionally at night or if allergies bad during the day  Needs dermatologist Concern for nose Cutaneous horn left arm  Not sleeping well Not excited about medication "awake more than I am asleep" Melatonin also wires her--doesn't help her sleep. Other OTC not effective Wants to try prescription  Mild reflux symptoms Uses Tums prn  Current Outpatient Prescriptions on File Prior to Visit  Medication Sig Dispense Refill  . albuterol (PROVENTIL HFA;VENTOLIN HFA) 108 (90 BASE) MCG/ACT inhaler Inhale 1-2 puffs into the lungs every 6 (six) hours as needed for wheezing or shortness of breath. 1 Inhaler 2  . atenolol (TENORMIN) 25 MG tablet TAKE 1 TABLET (25 MG TOTAL) BY MOUTH DAILY. 90 tablet 1  . diphenhydrAMINE (BENADRYL) 25 MG tablet Take 25 mg by mouth every 6 (six) hours as needed for allergies. Reported on 04/27/2015    . fluticasone (FLONASE) 50 MCG/ACT nasal spray Place 2 sprays into both nostrils daily as needed for allergies or rhinitis. 16 g 5  . ibuprofen (ADVIL,MOTRIN) 200 MG tablet Take 200 mg by mouth every 6 (six) hours as needed for headache or moderate pain. Reported on 04/27/2015    . loratadine (CLARITIN) 10 MG tablet Take 10 mg by mouth daily as needed for allergies or rhinitis. Reported on 04/27/2015    . potassium chloride SA (K-DUR,KLOR-CON) 20 MEQ tablet Take 1 by  mouth daily 30 tablet 3  . Probiotic Product (ALIGN PO) Take by mouth.    . Propylene Glycol (SYSTANE BALANCE) 0.6 % SOLN Apply to eye daily as needed (Allergic Rhinitis). Reported on 04/27/2015    . valACYclovir (VALTREX) 500 MG tablet Take 1 tablet (500 mg total) by mouth 2 (two) times daily. 30 tablet 6   No current facility-administered medications on file prior to visit.     Allergies  Allergen Reactions  . Codeine Nausea And Vomiting  . Other Diarrhea and Nausea And Vomiting    MSG    Past Medical History:  Diagnosis Date  . Allergic asthma   . Degenerative joint disease   . Duodenal ulcer   . GERD (gastroesophageal reflux disease)   . Herpes simplex of female genitalia   . History of chicken pox   . Hypertension   . Migraine   . Seasonal allergies     Past Surgical History:  Procedure Laterality Date  . CATARACT EXTRACTION W/ INTRAOCULAR LENS  IMPLANT, BILATERAL    . LIPOSUCTION    . ROOT CANAL    . TONSILLECTOMY  1978  . TOOTH EXTRACTION      Family History  Problem Relation Age of Onset  . Breast cancer Mother 22    Deceased  . Stroke Mother   . Hypertension Mother   . Cancer Mother     Breast cancer  . Ovarian cancer Sister   . Renal cancer Father   . Lung cancer Father  86    Deceased  . Melanoma Father   . Cancer Father   . Heart disease Maternal Grandmother   . Stomach cancer Paternal Grandfather   . Cancer - Other Maternal Aunt     Pancreatic  . Diabetes Brother   . Heart disease Maternal Aunt   . Heart attack Maternal Aunt   . Melanoma Paternal Aunt   . Multiple myeloma Paternal Aunt   . Brain cancer Brother     G-Blastoma  . Lung cancer Brother   . Lung cancer Maternal Aunt   . Rectal cancer Cousin   . Colon cancer Neg Hx   . Esophageal cancer Neg Hx     Social History   Social History  . Marital status: Divorced    Spouse name: N/A  . Number of children: 0  . Years of education: College   Occupational History  . [employer]     Counselor/Sales   Social History Main Topics  . Smoking status: Former Smoker    Quit date: 09/07/1981  . Smokeless tobacco: Never Used     Comment: Quit >30 yrs  . Alcohol use Yes     Comment: 0-2 drinks daily  . Drug use: No  . Sexual activity: Not Currently   Other Topics Concern  . Not on file   Social History Narrative   Lives at home w/ her sister/BIL      Review of Systems  Constitutional: Positive for fatigue. Negative for unexpected weight change.       Wears seat  belt  HENT: Positive for dental problem, hearing loss and tinnitus. Negative for trouble swallowing.        2 partials--not happy with them  Eyes: Negative for visual disturbance.       No diplopia or unilateral vision loss  Respiratory: Positive for cough and shortness of breath. Negative for chest tightness.   Cardiovascular: Positive for chest pain and palpitations. Negative for leg swelling.  Gastrointestinal: Negative for blood in stool and constipation.       Mild heartburn  Genitourinary: Positive for difficulty urinating. Negative for dyspareunia, dysuria and hematuria.       Some incontinence   Musculoskeletal: Positive for arthralgias, back pain and myalgias. Negative for joint swelling.  Skin:       Skin lesions need eval  Allergic/Immunologic: Positive for environmental allergies. Negative for immunocompromised state.  Neurological: Positive for headaches. Negative for dizziness, syncope and light-headedness.  Hematological:       Some past cervical and axillary nodes  Psychiatric/Behavioral: Positive for sleep disturbance. Negative for dysphoric mood. The patient is not nervous/anxious.        Objective:   Physical Exam  Constitutional: She is oriented to person, place, and time. She appears well-developed and well-nourished. No distress.  HENT:  Head: Normocephalic and atraumatic.  Right Ear: External ear normal.  Left Ear: External ear normal.  Mouth/Throat: Oropharynx is  clear and moist. No oropharyngeal exudate.  Eyes: Conjunctivae are normal. Pupils are equal, round, and reactive to light.  Neck: Normal range of motion. Neck supple. No thyromegaly present.  Cardiovascular: Normal rate, regular rhythm, normal heart sounds and intact distal pulses.  Exam reveals no gallop.   No murmur heard. Pulmonary/Chest: Effort normal and breath sounds normal. No respiratory distress. She has no wheezes. She has no rales.  Abdominal: Soft. There is no tenderness.  Musculoskeletal: She exhibits no edema or tenderness.  Lymphadenopathy:    She has no  cervical adenopathy.  Neurological: She is alert and oriented to person, place, and time.  Skin:  Cutaneous horn  Psychiatric: She has a normal mood and affect. Her behavior is normal.          Assessment & Plan:

## 2015-12-22 NOTE — Assessment & Plan Note (Signed)
With strong FH of melanoma Will set up with derm

## 2015-12-22 NOTE — Assessment & Plan Note (Addendum)
Persistent cough for 2 months Some vague chest symptoms sound asthma related Will check CXR  CXR reassuring Will try montelukast

## 2015-12-22 NOTE — Patient Instructions (Addendum)
Try the trazodone for sleep. 1 tab at bedtime for 1 week, then increase to 2 tabs if it hasn't helped. Let me know if you are still having problems  DASH Eating Plan DASH stands for "Dietary Approaches to Stop Hypertension." The DASH eating plan is a healthy eating plan that has been shown to reduce high blood pressure (hypertension). Additional health benefits may include reducing the risk of type 2 diabetes mellitus, heart disease, and stroke. The DASH eating plan may also help with weight loss. What do I need to know about the DASH eating plan? For the DASH eating plan, you will follow these general guidelines:  Choose foods with less than 150 milligrams of sodium per serving (as listed on the food label).  Use salt-free seasonings or herbs instead of table salt or sea salt.  Check with your health care provider or pharmacist before using salt substitutes.  Eat lower-sodium products. These are often labeled as "low-sodium" or "no salt added."  Eat fresh foods. Avoid eating a lot of canned foods.  Eat more vegetables, fruits, and low-fat dairy products.  Choose whole grains. Look for the word "whole" as the first word in the ingredient list.  Choose fish and skinless chicken or [place] more often than red meat. Limit fish, poultry, and meat to 6 oz (170 g) each day.  Limit sweets, desserts, sugars, and sugary drinks.  Choose heart-healthy fats.  Eat more home-cooked food and less restaurant, buffet, and fast food.  Limit fried foods.  Do not fry foods. Cook foods using methods such as baking, boiling, grilling, and broiling instead.  When eating at a restaurant, ask that your food be prepared with less salt, or no salt if possible. What foods can I eat? Seek help from a dietitian for individual calorie needs. Grains  Whole grain or whole wheat bread. Brown rice. Whole grain or whole wheat pasta. Quinoa, bulgur, and whole grain cereals. Low-sodium cereals. Corn or whole wheat  flour tortillas. Whole grain cornbread. Whole grain crackers. Low-sodium crackers. Vegetables  Fresh or frozen vegetables (raw, steamed, roasted, or grilled). Low-sodium or reduced-sodium tomato and vegetable juices. Low-sodium or reduced-sodium tomato sauce and paste. Low-sodium or reduced-sodium canned vegetables. Fruits  All fresh, canned (in natural juice), or frozen fruits. Meat and Other Protein Products  Ground beef (85% or leaner), grass-fed beef, or beef trimmed of fat. Skinless chicken or [place]. Ground chicken or [place]. Pork trimmed of fat. All fish and seafood. Eggs. Dried beans, peas, or lentils. Unsalted nuts and seeds. Unsalted canned beans. Dairy  Low-fat dairy products, such as skim or 1% milk, 2% or reduced-fat cheeses, low-fat ricotta or cottage cheese, or plain low-fat yogurt. Low-sodium or reduced-sodium cheeses. Fats and Oils  Tub margarines without trans fats. Light or reduced-fat mayonnaise and salad dressings (reduced sodium). Avocado. Safflower, olive, or canola oils. Natural peanut or almond butter. Other  Unsalted popcorn and pretzels. The items listed above may not be a complete list of recommended foods or beverages. Contact your dietitian for more options.  What foods are not recommended? Grains  White bread. White pasta. White rice. Refined cornbread. Bagels and croissants. Crackers that contain trans fat. Vegetables  Creamed or fried vegetables. Vegetables in a cheese sauce. Regular canned vegetables. Regular canned tomato sauce and paste. Regular tomato and vegetable juices. Fruits  Canned fruit in light or heavy syrup. Fruit juice. Meat and Other Protein Products  Fatty cuts of meat. Ribs, chicken wings, bacon, sausage, bologna, salami, chitterlings, fatback, hot  dogs, bratwurst, and packaged luncheon meats. Salted nuts and seeds. Canned beans with salt. Dairy  Whole or 2% milk, cream, half-and-half, and cream cheese. Whole-fat or sweetened yogurt. Full-fat  cheeses or blue cheese. Nondairy creamers and whipped toppings. Processed cheese, cheese spreads, or cheese curds. Condiments  Onion and garlic salt, seasoned salt, table salt, and sea salt. Canned and packaged gravies. Worcestershire sauce. Tartar sauce. Barbecue sauce. Teriyaki sauce. Soy sauce, including reduced sodium. Steak sauce. Fish sauce. Oyster sauce. Cocktail sauce. Horseradish. Ketchup and mustard. Meat flavorings and tenderizers. Bouillon cubes. Hot sauce. Tabasco sauce. Marinades. Taco seasonings. Relishes. Fats and Oils  Butter, stick margarine, lard, shortening, ghee, and bacon fat. Coconut, palm kernel, or palm oils. Regular salad dressings. Other  Pickles and olives. Salted popcorn and pretzels. The items listed above may not be a complete list of foods and beverages to avoid. Contact your dietitian for more information.  Where can I find more information? National Heart, Lung, and Blood Institute: travelstabloid.com This information is not intended to replace advice given to you by your health care provider. Make sure you discuss any questions you have with your health care provider. Document Released: 01/06/2011 Document Revised: 06/25/2015 Document Reviewed: 11/21/2012 Elsevier Interactive Patient Education  2017 [employer].

## 2015-12-22 NOTE — Assessment & Plan Note (Signed)
Will try trazodone

## 2015-12-22 NOTE — Assessment & Plan Note (Signed)
Recent colon, pap and mammo Prefers no flu vaccine--discussed Discussed exercise and DASH info given

## 2016-02-11 ENCOUNTER — Encounter: Payer: Self-pay | Admitting: Gastroenterology

## 2016-03-24 ENCOUNTER — Other Ambulatory Visit: Payer: Self-pay | Admitting: Physician Assistant

## 2016-03-24 NOTE — Telephone Encounter (Signed)
Approved: okay for a year--she established with me

## 2016-05-07 ENCOUNTER — Other Ambulatory Visit: Payer: Self-pay | Admitting: Physician Assistant

## 2016-05-09 NOTE — Telephone Encounter (Signed)
Approved: okay x 1 year 

## 2016-05-13 ENCOUNTER — Other Ambulatory Visit: Payer: Self-pay | Admitting: Physician Assistant

## 2016-05-13 ENCOUNTER — Encounter: Payer: Self-pay | Admitting: Internal Medicine

## 2016-05-13 MED ORDER — ATENOLOL 25 MG PO TABS
ORAL_TABLET | ORAL | 2 refills | Status: AC
Start: 2016-05-13 — End: ?

## 2016-05-30 ENCOUNTER — Encounter: Payer: Self-pay | Admitting: Family Medicine

## 2016-05-30 ENCOUNTER — Ambulatory Visit (INDEPENDENT_AMBULATORY_CARE_PROVIDER_SITE_OTHER): Payer: BLUE CROSS/BLUE SHIELD | Admitting: Family Medicine

## 2016-05-30 DIAGNOSIS — M545 Low back pain, unspecified: Secondary | ICD-10-CM

## 2016-05-30 MED ORDER — METHOCARBAMOL 500 MG PO TABS: 500.0000 mg | ORAL_TABLET | Freq: Four times a day (QID) | ORAL | 0 refills | Status: DC

## 2016-05-30 MED ORDER — OXYCODONE-ACETAMINOPHEN 5-325 MG PO TABS: 0.5000 | ORAL_TABLET | Freq: Three times a day (TID) | ORAL | 0 refills | Status: DC | PRN

## 2016-05-30 NOTE — Progress Notes (Signed)
Pre visit review using our clinic review tool, if applicable. No additional management support is needed unless otherwise documented below in the visit note. 

## 2016-05-30 NOTE — Assessment & Plan Note (Signed)
Nontoxic. No need for imaging. Differential diagnosis discussed with patient. No emergent symptoms but she is uncomfortable. Discussed with patient about previous med intolerances, failures.  She could have nerve root irritation but she does not have typical sciatica symptoms. She likely does have significant muscle spasm in the paraspinal muscles. No weakness. Take up to 2 ibuprofen with food 3 times a day.   Use methocarbamol for muscle spasms.   Use percocet if needed, sedation caution.  Gently stretch.  Ice and heat as needed/tolerated.  Update [place] as needed. Okay for outpatient follow-up.

## 2016-05-30 NOTE — Progress Notes (Signed)
L hip pain.  Started yesterday.  No trauma.  She was fixing a toilet, stooping over, and when she was getting back up she had quick onset of pain.  No weakness in the BLE.  No foot drop.  No B/B continence.  h/o sciatica on L side.  No new numbness in the legs.  No fevers, no chills.  No rash.  No trauma o/w, no falls.    Failed tx with flexeril prev.   Already on ibuprofen.   She can't tolerate hydrocodone.  She has tolerated oxycodone in the past.    Meds, vitals, and allergies reviewed.   ROS: Per HPI unless specifically indicated in ROS section   nad but uncomfortable ncat Neck supple no LA rrr ctab Back w/o midline pain No CVA pain L lower back ttp without bruising or rash. Right lower back not tender to palpation. Normal hip range of motion. She has back pain on left straight leg raise but it does not radiate down the leg. Negative right straight leg raise Strength and sensation grossly intact for the bilateral lower extremities. No foot drop.

## 2016-05-30 NOTE — Patient Instructions (Addendum)
Take up to 2 ibuprofen with food 3 times a day.   Use methocarbamol for muscle spasms.   Use percocet if needed, sedation caution.  Gently stretch.  Ice and heat as needed/tolerated.  Take care.  Glad to see you.

## 2016-06-05 ENCOUNTER — Encounter: Payer: Self-pay | Admitting: Internal Medicine

## 2016-06-05 DIAGNOSIS — A6000 Herpesviral infection of urogenital system, unspecified: Secondary | ICD-10-CM

## 2016-06-06 NOTE — Telephone Encounter (Signed)
Trazodone  Last refill 12/22/15 #60/1 Last office visit 05/30/16/acute

## 2016-06-06 NOTE — Telephone Encounter (Signed)
Approved: 30 x 0 

## 2016-06-07 MED ORDER — TRAZODONE HCL 50 MG PO TABS: 50.0000 mg | ORAL_TABLET | Freq: Every day | ORAL | 11 refills | Status: DC

## 2016-06-07 MED ORDER — VALACYCLOVIR HCL 500 MG PO TABS: 500.0000 mg | ORAL_TABLET | Freq: Two times a day (BID) | ORAL | 0 refills | Status: DC

## 2016-06-07 NOTE — Telephone Encounter (Signed)
Approved: trazodone okay for a year

## 2016-07-07 ENCOUNTER — Other Ambulatory Visit: Payer: Self-pay | Admitting: Internal Medicine

## 2016-07-07 DIAGNOSIS — A6000 Herpesviral infection of urogenital system, unspecified: Secondary | ICD-10-CM

## 2016-08-16 ENCOUNTER — Encounter: Payer: Self-pay | Admitting: Physician Assistant

## 2016-08-16 ENCOUNTER — Ambulatory Visit (INDEPENDENT_AMBULATORY_CARE_PROVIDER_SITE_OTHER): Payer: BLUE CROSS/BLUE SHIELD | Admitting: Physician Assistant

## 2016-08-16 VITALS — BP 120/98 | HR 63 | Temp 98.5°F | Resp 14 | Ht 63.0 in | Wt 213.0 lb

## 2016-08-16 DIAGNOSIS — M7989 Other specified soft tissue disorders: Secondary | ICD-10-CM | POA: Diagnosis not present

## 2016-08-16 NOTE — Progress Notes (Signed)
Patient presents to clinic today c/o 2 weeks of left lower extremity swelling that is intermittent, and better with elevation and in the morning. Has noted posterior L calf pain over the past couple of days. Denies any recent trauma or injury but just moved homes over the past 3-4 weeks. Denies chest pain, SOB, fatigue. Has not taken anything for symptoms. Denies recent surgery or prolonged immobilization. Denies history of peripheral vascular disease or clot.  Past Medical History:  Diagnosis Date  . Allergic asthma   . Degenerative joint disease   . Duodenal ulcer   . GERD (gastroesophageal reflux disease)   . Herpes simplex of female genitalia   . History of chicken pox   . Hypertension   . Migraine   . Seasonal allergies     Current Outpatient Prescriptions on File Prior to Visit  Medication Sig Dispense Refill  . albuterol (PROVENTIL HFA;VENTOLIN HFA) 108 (90 BASE) MCG/ACT inhaler Inhale 1-2 puffs into the lungs every 6 (six) hours as needed for wheezing or shortness of breath. 1 Inhaler 2  . atenolol (TENORMIN) 25 MG tablet TAKE 1 TABLET (25 MG TOTAL) BY MOUTH DAILY. 90 tablet 2  . Cholecalciferol (VITAMIN D) 2000 units CAPS Take 2 capsules by mouth.    . fluticasone (FLONASE) 50 MCG/ACT nasal spray Place 2 sprays into both nostrils daily as needed for allergies or rhinitis. 16 g 5  . KLOR-CON M20 20 MEQ tablet TAKE 1 TABLET BY MOUTH DAILY 30 tablet 11  . montelukast (SINGULAIR) 10 MG tablet Take 1 tablet (10 mg total) by mouth at bedtime. 30 tablet 11  . traZODone (DESYREL) 50 MG tablet Take 1-2 tablets (50-100 mg total) by mouth at bedtime. 60 tablet 11  . valACYclovir (VALTREX) 500 MG tablet TAKE 1 TABLET BY MOUTH TWICE A DAY 30 tablet 2  . vitamin B-12 (CYANOCOBALAMIN) 1000 MCG tablet Take 1,000 mcg by mouth daily.     No current facility-administered medications on file prior to visit.     Allergies  Allergen Reactions  . Codeine Nausea And Vomiting  . Hydrocodone  Other (See Comments)    Vomiting  . Other Diarrhea and Nausea And Vomiting    MSG    Family History  Problem Relation Age of Onset  . Breast cancer Mother 80       Deceased  . Stroke Mother   . Hypertension Mother   . Cancer Mother        Breast cancer  . Ovarian cancer Sister   . Renal cancer Father   . Lung cancer Father 45       Deceased  . Melanoma Father   . Cancer Father   . Heart disease Maternal Grandmother   . Stomach cancer Paternal Grandfather   . Cancer - Other Maternal Aunt        Pancreatic  . Diabetes Brother   . Heart disease Maternal Aunt   . Heart attack Maternal Aunt   . Melanoma Paternal Aunt   . Multiple myeloma Paternal Aunt   . Brain cancer Brother        G-Blastoma  . Lung cancer Brother   . Lung cancer Maternal Aunt   . Rectal cancer Cousin   . Colon cancer Neg Hx   . Esophageal cancer Neg Hx     Social History   Social History  . Marital status: Divorced    Spouse name: N/A  . Number of children: 0  . Years of education: [employer]  Occupational History  . [employer]     Counselor/Sales   Social History Main Topics  . Smoking status: Former Smoker    Quit date: 09/07/1981  . Smokeless tobacco: Never Used     Comment: Quit >30 yrs  . Alcohol use Yes     Comment: 0-2 drinks daily  . Drug use: No  . Sexual activity: Not Currently   Other Topics Concern  . None   Social History Narrative   Lives at home w/ her sister/BIL      Review of Systems - See HPI.  All other ROS are negative.  BP (!) 120/98   Pulse 63   Temp 98.5 F (36.9 C) (Oral)   Resp 14   Ht _0  (1.6 m)   Wt 213 lb (96.6 kg)   SpO2 97%   BMI 37.73 kg/m   Physical Exam  Constitutional: She is oriented to person, place, and time and well-developed, well-nourished, and in no distress.  HENT:  Head: Normocephalic and atraumatic.  Eyes: Conjunctivae are normal.  Neck: Neck supple.  Cardiovascular: Normal rate, regular rhythm, normal heart sounds  and intact distal pulses.   Negative edema of RLE. Very trace non-pitting edema of LLE noted on examination. Pulses 2+ and equal. Extremities are neurovascularly intact bilaterally. Negative Homan signs.  Pulmonary/Chest: Effort normal and breath sounds normal. No respiratory distress. She has no wheezes. She has no rales. She exhibits no tenderness.  Neurological: She is alert and oriented to person, place, and time.  Skin: Skin is warm and dry. No rash noted.  Psychiatric: Affect normal.  Vitals reviewed.  Assessment/Plan: 1. Leg swelling Will obtain US to r/o clot as that would be the most urgent etiology of symptoms although I feel there is a lower suspicion of this. Supportive measures and OTC medications reviewed. Will alter regimen based on results. ER precautions given.   - US Venous Img Lower Unilateral Left; Future  Of note DBP elevated today, even on recheck. Patient to start DASH diet and follow-up with PCP for further management.  Leeanne Rio, PA-C

## 2016-08-16 NOTE — Progress Notes (Signed)
Pre visit review using our clinic review tool, if applicable. No additional management support is needed unless otherwise documented below in the visit note. 

## 2016-08-16 NOTE — Patient Instructions (Addendum)
Please stop by the front desk to speak with Levada Dy regarding your ultrasound.  I will call with these results. Start Ibuprofen or Tylenol as needed for pain. Topical Icy Hot or Aspercreme will also be beneficial.  We will alter regimen based on results.  Please follow diet below to help with blood pressure. Schedule a follow-up appointment with Dr. Silvio Pate your PCP for further assessment of blood pressure and chronic health issues.    DASH Eating Plan DASH stands for "Dietary Approaches to Stop Hypertension." The DASH eating plan is a healthy eating plan that has been shown to reduce high blood pressure (hypertension). It may also reduce your risk for type 2 diabetes, heart disease, and stroke. The DASH eating plan may also help with weight loss. What are tips for following this plan? General guidelines  Avoid eating more than 2,300 mg (milligrams) of salt (sodium) a day. If you have hypertension, you may need to reduce your sodium intake to 1,500 mg a day.  Limit alcohol intake to no more than 1 drink a day for nonpregnant women and 2 drinks a day for men. One drink equals 12 oz of beer, 5 oz of wine, or 1 oz of hard liquor.  Work with your health care provider to maintain a healthy body weight or to lose weight. Ask what an ideal weight is for you.  Get at least 30 minutes of exercise that causes your heart to beat faster (aerobic exercise) most days of the week. Activities may include walking, swimming, or biking.  Work with your health care provider or diet and nutrition specialist (dietitian) to adjust your eating plan to your individual calorie needs. Reading food labels  Check food labels for the amount of sodium per serving. Choose foods with less than 5 percent of the Daily Value of sodium. Generally, foods with less than 300 mg of sodium per serving fit into this eating plan.  To find whole grains, look for the word "whole" as the first word in the ingredient  list. Shopping  Buy products labeled as "low-sodium" or "no salt added."  Buy fresh foods. Avoid canned foods and premade or frozen meals. Cooking  Avoid adding salt when cooking. Use salt-free seasonings or herbs instead of table salt or sea salt. Check with your health care provider or pharmacist before using salt substitutes.  Do not fry foods. Cook foods using healthy methods such as baking, boiling, grilling, and broiling instead.  Cook with heart-healthy oils, such as olive, canola, soybean, or sunflower oil. Meal planning   Eat a balanced diet that includes: ? 5 or more servings of fruits and vegetables each day. At each meal, try to fill half of your plate with fruits and vegetables. ? Up to 6-8 servings of whole grains each day. ? Less than 6 oz of lean meat, poultry, or fish each day. A 3-oz serving of meat is about the same size as a deck of cards. One egg equals 1 oz. ? 2 servings of low-fat dairy each day. ? A serving of nuts, seeds, or beans 5 times each week. ? Heart-healthy fats. Healthy fats called Omega-3 fatty acids are found in foods such as flaxseeds and coldwater fish, like sardines, salmon, and mackerel.  Limit how much you eat of the following: ? Canned or prepackaged foods. ? Food that is high in trans fat, such as fried foods. ? Food that is high in saturated fat, such as fatty meat. ? Sweets, desserts, sugary drinks, and other  foods with added sugar. ? Full-fat dairy products.  Do not salt foods before eating.  Try to eat at least 2 vegetarian meals each week.  Eat more home-cooked food and less restaurant, buffet, and fast food.  When eating at a restaurant, ask that your food be prepared with less salt or no salt, if possible. What foods are recommended? The items listed may not be a complete list. Talk with your dietitian about what dietary choices are best for you. Grains Whole-grain or whole-wheat bread. Whole-grain or whole-wheat pasta. Brown  rice. [place]. Bulgur. Whole-grain and low-sodium cereals. Pita bread. Low-fat, low-sodium crackers. Whole-wheat flour tortillas. Vegetables Fresh or frozen vegetables (raw, steamed, roasted, or grilled). Low-sodium or reduced-sodium tomato and vegetable juice. Low-sodium or reduced-sodium tomato sauce and tomato paste. Low-sodium or reduced-sodium canned vegetables. Fruits All fresh, dried, or frozen fruit. Canned fruit in natural juice (without added sugar). Meat and other protein foods Skinless chicken or [place]. Ground chicken or [place]. Pork with fat trimmed off. Fish and seafood. Egg whites. Dried beans, peas, or lentils. Unsalted nuts, nut butters, and seeds. Unsalted canned beans. Lean cuts of beef with fat trimmed off. Low-sodium, lean deli meat. Dairy Low-fat (1%) or fat-free (skim) milk. Fat-free, low-fat, or reduced-fat cheeses. Nonfat, low-sodium ricotta or cottage cheese. Low-fat or nonfat yogurt. Low-fat, low-sodium cheese. Fats and oils Soft margarine without trans fats. Vegetable oil. Low-fat, reduced-fat, or light mayonnaise and salad dressings (reduced-sodium). Canola, safflower, olive, soybean, and sunflower oils. Avocado. Seasoning and other foods Herbs. Spices. Seasoning mixes without salt. Unsalted popcorn and pretzels. Fat-free sweets. What foods are not recommended? The items listed may not be a complete list. Talk with your dietitian about what dietary choices are best for you. Grains Baked goods made with fat, such as croissants, muffins, or some breads. Dry pasta or rice meal packs. Vegetables Creamed or fried vegetables. Vegetables in a cheese sauce. Regular canned vegetables (not low-sodium or reduced-sodium). Regular canned tomato sauce and paste (not low-sodium or reduced-sodium). Regular tomato and vegetable juice (not low-sodium or reduced-sodium). Angie Fava. Olives. Fruits Canned fruit in a light or heavy syrup. Fried fruit. Fruit in cream or butter  sauce. Meat and other protein foods Fatty cuts of meat. Ribs. Fried meat. Berniece Salines. Sausage. Bologna and other processed lunch meats. Salami. Fatback. Hotdogs. Bratwurst. Salted nuts and seeds. Canned beans with added salt. Canned or smoked fish. Whole eggs or egg yolks. Chicken or [place] with skin. Dairy Whole or 2% milk, cream, and half-and-half. Whole or full-fat cream cheese. Whole-fat or sweetened yogurt. Full-fat cheese. Nondairy creamers. Whipped toppings. Processed cheese and cheese spreads. Fats and oils Butter. Stick margarine. Lard. Shortening. Ghee. Bacon fat. Tropical oils, such as coconut, palm kernel, or palm oil. Seasoning and other foods Salted popcorn and pretzels. Onion salt, garlic salt, seasoned salt, table salt, and sea salt. Worcestershire sauce. Tartar sauce. Barbecue sauce. Teriyaki sauce. Soy sauce, including reduced-sodium. Steak sauce. Canned and packaged gravies. Fish sauce. Oyster sauce. Cocktail sauce. Horseradish that you find on the shelf. Ketchup. Mustard. Meat flavorings and tenderizers. Bouillon cubes. Hot sauce and Tabasco sauce. Premade or packaged marinades. Premade or packaged taco seasonings. Relishes. Regular salad dressings. Where to find more information:  National Heart, Lung, and [place]: https://wilson-eaton.com/  American Heart Association: www.heart.org Summary  The DASH eating plan is a healthy eating plan that has been shown to reduce high blood pressure (hypertension). It may also reduce your risk for type 2 diabetes, heart disease, and stroke.  With  the DASH eating plan, you should limit salt (sodium) intake to 2,300 mg a day. If you have hypertension, you may need to reduce your sodium intake to 1,500 mg a day.  When on the DASH eating plan, aim to eat more fresh fruits and vegetables, whole grains, lean proteins, low-fat dairy, and heart-healthy fats.  Work with your health care provider or diet and nutrition specialist (dietitian) to adjust  your eating plan to your individual calorie needs. This information is not intended to replace advice given to you by your health care provider. Make sure you discuss any questions you have with your health care provider. Document Released: 01/06/2011 Document Revised: 01/11/2016 Document Reviewed: 01/11/2016 Elsevier Interactive Patient Education  2017 [employer].

## 2016-08-18 ENCOUNTER — Ambulatory Visit
Admission: RE | Admit: 2016-08-18 | Discharge: 2016-08-18 | Disposition: A | Discharge status: Home / Self Care | Payer: BLUE CROSS/BLUE SHIELD | Source: Ambulatory Visit | Attending: Physician Assistant | Admitting: Physician Assistant

## 2016-08-18 DIAGNOSIS — M7989 Other specified soft tissue disorders: Secondary | ICD-10-CM

## 2016-09-29 DIAGNOSIS — Z1389 Encounter for screening for other disorder: Secondary | ICD-10-CM | POA: Diagnosis not present

## 2016-09-29 DIAGNOSIS — M7989 Other specified soft tissue disorders: Secondary | ICD-10-CM | POA: Diagnosis not present

## 2016-09-29 DIAGNOSIS — Z6837 Body mass index (BMI) 37.0-37.9, adult: Secondary | ICD-10-CM | POA: Diagnosis not present

## 2016-09-29 DIAGNOSIS — D179 Benign lipomatous neoplasm, unspecified: Secondary | ICD-10-CM | POA: Diagnosis not present

## 2016-09-30 ENCOUNTER — Other Ambulatory Visit (HOSPITAL_COMMUNITY): Payer: Self-pay | Admitting: Internal Medicine

## 2016-09-30 DIAGNOSIS — D1724 Benign lipomatous neoplasm of skin and subcutaneous tissue of left leg: Secondary | ICD-10-CM

## 2016-10-07 ENCOUNTER — Ambulatory Visit (HOSPITAL_COMMUNITY)
Admission: RE | Admit: 2016-10-07 | Discharge: 2016-10-07 | Disposition: A | Discharge status: Home / Self Care | Payer: PPO | Source: Ambulatory Visit | Attending: Internal Medicine | Admitting: Internal Medicine

## 2016-10-07 ENCOUNTER — Other Ambulatory Visit (HOSPITAL_COMMUNITY): Payer: Self-pay | Admitting: Internal Medicine

## 2016-10-07 DIAGNOSIS — M25472 Effusion, left ankle: Secondary | ICD-10-CM | POA: Insufficient documentation

## 2016-10-07 DIAGNOSIS — M65872 Other synovitis and tenosynovitis, left ankle and foot: Secondary | ICD-10-CM | POA: Diagnosis not present

## 2016-10-07 DIAGNOSIS — D1724 Benign lipomatous neoplasm of skin and subcutaneous tissue of left leg: Secondary | ICD-10-CM

## 2016-10-07 DIAGNOSIS — M25772 Osteophyte, left ankle: Secondary | ICD-10-CM | POA: Diagnosis not present

## 2016-10-17 DIAGNOSIS — R509 Fever, unspecified: Secondary | ICD-10-CM | POA: Diagnosis not present

## 2016-10-17 DIAGNOSIS — J029 Acute pharyngitis, unspecified: Secondary | ICD-10-CM | POA: Diagnosis not present

## 2016-10-17 DIAGNOSIS — Z6837 Body mass index (BMI) 37.0-37.9, adult: Secondary | ICD-10-CM | POA: Diagnosis not present

## 2016-10-17 DIAGNOSIS — R05 Cough: Secondary | ICD-10-CM | POA: Diagnosis not present

## 2016-11-25 DIAGNOSIS — M19172 Post-traumatic osteoarthritis, left ankle and foot: Secondary | ICD-10-CM | POA: Diagnosis not present

## 2016-11-25 DIAGNOSIS — G8929 Other chronic pain: Secondary | ICD-10-CM | POA: Diagnosis not present

## 2016-11-25 DIAGNOSIS — M25572 Pain in left ankle and joints of left foot: Secondary | ICD-10-CM | POA: Diagnosis not present

## 2017-09-21 DIAGNOSIS — R82998 Other abnormal findings in urine: Secondary | ICD-10-CM | POA: Diagnosis not present

## 2017-09-21 DIAGNOSIS — Z79899 Other long term (current) drug therapy: Secondary | ICD-10-CM | POA: Diagnosis not present

## 2017-09-27 DIAGNOSIS — Z1389 Encounter for screening for other disorder: Secondary | ICD-10-CM | POA: Diagnosis not present

## 2017-09-27 DIAGNOSIS — D126 Benign neoplasm of colon, unspecified: Secondary | ICD-10-CM | POA: Diagnosis not present

## 2017-09-27 DIAGNOSIS — Z0000 Encounter for general adult medical examination without abnormal findings: Secondary | ICD-10-CM | POA: Diagnosis not present

## 2017-09-27 DIAGNOSIS — K269 Duodenal ulcer, unspecified as acute or chronic, without hemorrhage or perforation: Secondary | ICD-10-CM | POA: Diagnosis not present

## 2017-09-27 DIAGNOSIS — Z6839 Body mass index (BMI) 39.0-39.9, adult: Secondary | ICD-10-CM | POA: Diagnosis not present

## 2017-09-27 DIAGNOSIS — D179 Benign lipomatous neoplasm, unspecified: Secondary | ICD-10-CM | POA: Diagnosis not present

## 2017-09-27 DIAGNOSIS — D692 Other nonthrombocytopenic purpura: Secondary | ICD-10-CM | POA: Diagnosis not present

## 2017-09-27 DIAGNOSIS — J4530 Mild persistent asthma, uncomplicated: Secondary | ICD-10-CM | POA: Diagnosis not present

## 2017-09-27 DIAGNOSIS — B009 Herpesviral infection, unspecified: Secondary | ICD-10-CM | POA: Diagnosis not present

## 2017-09-27 DIAGNOSIS — R7989 Other specified abnormal findings of blood chemistry: Secondary | ICD-10-CM | POA: Diagnosis not present

## 2017-09-27 DIAGNOSIS — K219 Gastro-esophageal reflux disease without esophagitis: Secondary | ICD-10-CM | POA: Diagnosis not present

## 2017-09-27 DIAGNOSIS — J302 Other seasonal allergic rhinitis: Secondary | ICD-10-CM | POA: Diagnosis not present

## 2017-09-28 ENCOUNTER — Encounter: Payer: Self-pay | Admitting: Gastroenterology

## 2017-09-28 ENCOUNTER — Other Ambulatory Visit: Payer: Self-pay | Admitting: Internal Medicine

## 2017-09-28 DIAGNOSIS — Z1231 Encounter for screening mammogram for malignant neoplasm of breast: Secondary | ICD-10-CM

## 2017-10-03 DIAGNOSIS — Z1212 Encounter for screening for malignant neoplasm of rectum: Secondary | ICD-10-CM | POA: Diagnosis not present

## 2017-10-04 ENCOUNTER — Other Ambulatory Visit: Payer: Self-pay | Admitting: Internal Medicine

## 2017-10-04 DIAGNOSIS — N6320 Unspecified lump in the left breast, unspecified quadrant: Secondary | ICD-10-CM

## 2017-10-09 ENCOUNTER — Ambulatory Visit
Admission: RE | Admit: 2017-10-09 | Discharge: 2017-10-09 | Disposition: A | Discharge status: Home / Self Care | Payer: PPO | Source: Ambulatory Visit | Attending: Internal Medicine | Admitting: Internal Medicine

## 2017-10-09 DIAGNOSIS — N6320 Unspecified lump in the left breast, unspecified quadrant: Secondary | ICD-10-CM

## 2017-10-09 DIAGNOSIS — N644 Mastodynia: Secondary | ICD-10-CM | POA: Diagnosis not present

## 2017-10-09 DIAGNOSIS — R928 Other abnormal and inconclusive findings on diagnostic imaging of breast: Secondary | ICD-10-CM | POA: Diagnosis not present

## 2017-10-09 DIAGNOSIS — N6322 Unspecified lump in the left breast, upper inner quadrant: Secondary | ICD-10-CM | POA: Diagnosis not present

## 2017-10-30 DIAGNOSIS — H35033 Hypertensive retinopathy, bilateral: Secondary | ICD-10-CM | POA: Diagnosis not present

## 2017-10-30 DIAGNOSIS — H26492 Other secondary cataract, left eye: Secondary | ICD-10-CM | POA: Diagnosis not present

## 2017-10-30 DIAGNOSIS — H1013 Acute atopic conjunctivitis, bilateral: Secondary | ICD-10-CM | POA: Diagnosis not present

## 2017-10-30 DIAGNOSIS — H04123 Dry eye syndrome of bilateral lacrimal glands: Secondary | ICD-10-CM | POA: Diagnosis not present

## 2017-10-30 DIAGNOSIS — Z961 Presence of intraocular lens: Secondary | ICD-10-CM | POA: Diagnosis not present

## 2017-10-30 DIAGNOSIS — H35363 Drusen (degenerative) of macula, bilateral: Secondary | ICD-10-CM | POA: Diagnosis not present

## 2017-11-02 DIAGNOSIS — H26492 Other secondary cataract, left eye: Secondary | ICD-10-CM | POA: Diagnosis not present

## 2017-11-15 ENCOUNTER — Ambulatory Visit (AMBULATORY_SURGERY_CENTER): Payer: Self-pay

## 2017-11-15 VITALS — Ht 64.0 in | Wt 225.0 lb

## 2017-11-15 DIAGNOSIS — I10 Essential (primary) hypertension: Secondary | ICD-10-CM | POA: Diagnosis not present

## 2017-11-15 DIAGNOSIS — L819 Disorder of pigmentation, unspecified: Secondary | ICD-10-CM | POA: Diagnosis not present

## 2017-11-15 DIAGNOSIS — Z86010 Personal history of colonic polyps: Secondary | ICD-10-CM

## 2017-11-15 DIAGNOSIS — Z6839 Body mass index (BMI) 39.0-39.9, adult: Secondary | ICD-10-CM | POA: Diagnosis not present

## 2017-11-15 MED ORDER — PEG 3350-KCL-NA BICARB-NACL 420 G PO SOLR: 4000.0000 mL | Freq: Once | ORAL | 0 refills | Status: AC

## 2017-11-15 NOTE — Progress Notes (Signed)
Per pt, no allergies to soy or egg products.Pt not taking any weight loss meds or using  O2 at home.  Pt refused emmi video. 

## 2017-11-16 ENCOUNTER — Encounter: Payer: Self-pay | Admitting: Gastroenterology

## 2017-11-29 ENCOUNTER — Ambulatory Visit (AMBULATORY_SURGERY_CENTER): Payer: PPO | Admitting: Gastroenterology

## 2017-11-29 ENCOUNTER — Encounter: Payer: Self-pay | Admitting: Gastroenterology

## 2017-11-29 VITALS — BP 131/67 | HR 79 | Temp 97.8°F | Resp 11 | Ht 63.0 in | Wt 213.0 lb

## 2017-11-29 DIAGNOSIS — D122 Benign neoplasm of ascending colon: Secondary | ICD-10-CM | POA: Diagnosis not present

## 2017-11-29 DIAGNOSIS — Z86010 Personal history of colonic polyps: Secondary | ICD-10-CM | POA: Diagnosis not present

## 2017-11-29 DIAGNOSIS — Z1211 Encounter for screening for malignant neoplasm of colon: Secondary | ICD-10-CM | POA: Diagnosis not present

## 2017-11-29 DIAGNOSIS — D120 Benign neoplasm of cecum: Secondary | ICD-10-CM | POA: Diagnosis not present

## 2017-11-29 DIAGNOSIS — D124 Benign neoplasm of descending colon: Secondary | ICD-10-CM

## 2017-11-29 DIAGNOSIS — D123 Benign neoplasm of transverse colon: Secondary | ICD-10-CM

## 2017-11-29 DIAGNOSIS — D125 Benign neoplasm of sigmoid colon: Secondary | ICD-10-CM

## 2017-11-29 DIAGNOSIS — K635 Polyp of colon: Secondary | ICD-10-CM | POA: Diagnosis not present

## 2017-11-29 MED ORDER — SODIUM CHLORIDE 0.9 % IV SOLN: 500.0000 mL | Freq: Once | INTRAVENOUS | Status: DC

## 2017-11-29 MED ORDER — CIPROFLOXACIN HCL 500 MG PO TABS: 500.0000 mg | ORAL_TABLET | Freq: Two times a day (BID) | ORAL | 0 refills | Status: AC

## 2017-11-29 MED ORDER — METRONIDAZOLE 500 MG PO TABS: 500.0000 mg | ORAL_TABLET | Freq: Two times a day (BID) | ORAL | 0 refills | Status: DC

## 2017-11-29 NOTE — Progress Notes (Signed)
Called to room to assist during endoscopic procedure.  Patient ID and intended procedure confirmed with present staff. Received instructions for my participation in the procedure from the performing physician.  

## 2017-11-29 NOTE — Patient Instructions (Signed)
*   HANDOUT ON POLYPS, DIVERTICULITIS, HEMORRHOIDS GIVEN. ANTIBIOTICS ORDERED PER DR. STARK  YOU HAD AN ENDOSCOPIC PROCEDURE TODAY AT [facility]:   Refer to the procedure report that was given to you for any specific questions about what was found during the examination.  If the procedure report does not answer your questions, please call your gastroenterologist to clarify.  If you requested that your care partner not be given the details of your procedure findings, then the procedure report has been included in a sealed envelope for you to review at your convenience later.  YOU SHOULD EXPECT: Some feelings of bloating in the abdomen. Passage of more gas than usual.  Walking can help get rid of the air that was put into your GI tract during the procedure and reduce the bloating. If you had a lower endoscopy (such as a colonoscopy or flexible sigmoidoscopy) you may notice spotting of blood in your stool or on the toilet paper. If you underwent a bowel prep for your procedure, you may not have a normal bowel movement for a few days.  Please Note:  You might notice some irritation and congestion in your nose or some drainage.  This is from the oxygen used during your procedure.  There is no need for concern and it should clear up in a day or so.  SYMPTOMS TO REPORT IMMEDIATELY:   Following lower endoscopy (colonoscopy or flexible sigmoidoscopy):  Excessive amounts of blood in the stool  Significant tenderness or worsening of abdominal pains  Swelling of the abdomen that is new, acute  Fever of 100F or higher   For urgent or emergent issues, a gastroenterologist can be reached at any hour by calling [phone].   DIET:  We do recommend a small meal at first, but then you may proceed to your regular diet.  Drink plenty of fluids but you should avoid alcoholic beverages for 24 hours.  ACTIVITY:  You should plan to take it easy for the rest of today and you should NOT DRIVE or  use heavy machinery until tomorrow (because of the sedation medicines used during the test).    FOLLOW UP: Our staff will call the number listed on your records the next business day following your procedure to check on you and address any questions or concerns that you may have regarding the information given to you following your procedure. If we do not reach you, we will leave a message.  However, if you are feeling well and you are not experiencing any problems, there is no need to return our call.  We will assume that you have returned to your regular daily activities without incident.  If any biopsies were taken you will be contacted by phone or by letter within the next 1-3 weeks.  Please call us at [phone] if you have not heard about the biopsies in 3 weeks.    SIGNATURES/CONFIDENTIALITY: You and/or your care partner have signed paperwork which will be entered into your electronic medical record.  These signatures attest to the fact that that the information above on your After Visit Summary has been reviewed and is understood.  Full responsibility of the confidentiality of this discharge information lies with you and/or your care-partner.

## 2017-11-29 NOTE — Op Note (Signed)
[facility] Patient Name: Briana Hull Procedure Date: 11/29/2017 10:32 AM MRN: [id number] Endoscopist: Ladene Artist , MD Age: 67 Referring MD:  Date of Birth: 07/30/1950 Gender: Female Account #: [id number] Procedure:                Colonoscopy Indications:              Surveillance: Personal history of adenomatous                            polyps on last colonoscopy 3 years ago Medicines:                Monitored Anesthesia Care Procedure:                Pre-Anesthesia Assessment:                           - Prior to the procedure, a History and Physical                            was performed, and patient medications and                            allergies were reviewed. The patient's tolerance of                            previous anesthesia was also reviewed. The risks                            and benefits of the procedure and the sedation                            options and risks were discussed with the patient.                            All questions were answered, and informed consent                            was obtained. Prior Anticoagulants: The patient has                            taken no previous anticoagulant or antiplatelet                            agents. ASA Grade Assessment: II - A patient with                            mild systemic disease. After reviewing the risks                            and benefits, the patient was deemed in                            satisfactory condition to undergo the procedure.  After obtaining informed consent, the colonoscope                            was passed under direct vision. Throughout the                            procedure, the patient's blood pressure, pulse, and                            oxygen saturations were monitored continuously. The                            Colonoscope was introduced through the anus and                            advanced to the the  cecum, identified by                            appendiceal orifice and ileocecal valve. The                            ileocecal valve, appendiceal orifice, and rectum                            were photographed. The quality of the bowel                            preparation was good. The colonoscopy was performed                            without difficulty. The patient tolerated the                            procedure well. Scope In: 10:38:50 AM Scope Out: 11:05:45 AM Scope Withdrawal Time: 0 hours 23 minutes 56 seconds  Total Procedure Duration: 0 hours 26 minutes 55 seconds  Findings:                 The perianal and digital rectal examinations were                            normal.                           A 16 mm polyp was found in the transverse colon.                            The polyp was sessile. The polyp was removed with a                            hot snare. Resection and retrieval were complete.                           Four sessile polyps were found in the sigmoid colon                            (  2), descending colon (1) and ascending colon (1).                            The polyps were 6 to 8 mm in size. These polyps                            were removed with a cold snare. Resection and                            retrieval were complete.                           Three sessile polyps were found in the ascending                            colon (1) and cecum (2). The polyps were 4 to 5 mm                            in size. These polyps were removed with a cold                            biopsy forceps. Resection and retrieval were                            complete.                           Multiple medium-mouthed diverticula were found in                            the left colon. There was narrowing of the colon in                            association with the diverticular opening. There                            was evidence of diverticular spasm.                             Peri-diverticular erythema was seen. Purulent                            discharge was seen in association with the                            diverticular opening, consistent with                            diverticulitis. There was no evidence of                            diverticular bleeding.  Internal hemorrhoids were found during                            retroflexion. The hemorrhoids were small and Grade                            I (internal hemorrhoids that do not prolapse).                           The exam was otherwise without abnormality on                            direct and retroflexion views. Complications:            No immediate complications. Estimated blood loss:                            None. Estimated Blood Loss:     Estimated blood loss: none. Impression:               - One 16 mm polyp in the transverse colon, removed                            with a hot snare. Resected and retrieved.                           - Four 6 to 8 mm polyps in the sigmoid colon, in                            the descending colon and in the ascending colon,                            removed with a cold snare. Resected and retrieved.                           - Three 4 to 5 mm polyps in the ascending colon and                            in the cecum, removed with a cold biopsy forceps.                            Resected and retrieved.                           - Moderate diverticulosis in the left colon.                            Purulent discharge was seen in association with the                            diverticular opening, indicative of diverticulitis.                           - Internal hemorrhoids.                           -  The examination was otherwise normal on direct                            and retroflexion views. Recommendation:           - Repeat colonoscopy in 3 years for surveillance.                            - Patient has a contact number available for                            emergencies. The signs and symptoms of potential                            delayed complications were discussed with the                            patient. Return to normal activities tomorrow.                            Written discharge instructions were provided to the                            patient.                           - Resume previous diet.                           - Continue present medications.                           - Await pathology results.                           - Cipro (ciprofloxacin) 500 mg PO BID for 7 days.                           - Flagyl (metronidazole) 500 mg PO BID for 7 days. Ladene Artist, MD 11/29/2017 11:18:23 AM This report has been signed electronically.

## 2017-11-29 NOTE — Progress Notes (Signed)
Alert and oriented x 3, pleased with MAC, report to RN

## 2017-11-29 NOTE — Progress Notes (Signed)
Pt's states no medical or surgical changes since previsit or office visit. 

## 2017-11-30 ENCOUNTER — Telehealth: Payer: Self-pay | Admitting: *Deleted

## 2017-11-30 NOTE — Telephone Encounter (Signed)
  Follow up Call-  Call back number 11/29/2017  Post procedure Call Back phone  # [phone]  Permission to leave phone message Yes  Some recent data might be hidden     Patient questions:  Do you have a fever, pain , or abdominal swelling? 6 Patient states that she came in with the pain and refuses to go to the ER.  States that she will take ibuprofen. Pain Score  6 *  Have you tolerated food without any problems? Yes.    Have you been able to return to your normal activities? No.  Do you have any questions about your discharge instructions: Diet   No. Medications  No. Follow up visit  No.  Do you have questions or concerns about your Care? Yes.    Actions: * If pain score is 4 or above: Physician/ provider Notified : Lucio Edward, MD.

## 2017-11-30 NOTE — Telephone Encounter (Signed)
Spoke with the patient and she understands Dr. Lynne Leader instructions.

## 2017-11-30 NOTE — Telephone Encounter (Signed)
  Follow up Call-  Call back number 11/29/2017  Post procedure Call Back phone  # [phone]  Permission to leave phone message Yes  Some recent data might be hidden     Patient questions:  Patient could not hear me.  Kept asking hello.

## 2017-11-30 NOTE — Telephone Encounter (Signed)
She had acute diverticulitis noted at colonoscopy yesterday. Please confirm she started Cipro and Flagyl as prescribed and that she completes the entire 7 day course.  If her abdominal pain is not controlled with a light diet and ibuprofen 400 - 600 mg tid prn then ED evaluation will be necessary.

## 2017-12-08 ENCOUNTER — Encounter: Payer: Self-pay | Admitting: Gastroenterology

## 2018-03-06 DIAGNOSIS — J4530 Mild persistent asthma, uncomplicated: Secondary | ICD-10-CM | POA: Diagnosis not present

## 2018-03-06 DIAGNOSIS — R05 Cough: Secondary | ICD-10-CM | POA: Diagnosis not present

## 2018-03-06 DIAGNOSIS — Z6837 Body mass index (BMI) 37.0-37.9, adult: Secondary | ICD-10-CM | POA: Diagnosis not present

## 2018-03-06 DIAGNOSIS — J302 Other seasonal allergic rhinitis: Secondary | ICD-10-CM | POA: Diagnosis not present

## 2018-03-06 DIAGNOSIS — I10 Essential (primary) hypertension: Secondary | ICD-10-CM | POA: Diagnosis not present

## 2018-03-06 DIAGNOSIS — J209 Acute bronchitis, unspecified: Secondary | ICD-10-CM | POA: Diagnosis not present

## 2018-06-07 DIAGNOSIS — D2271 Melanocytic nevi of right lower limb, including hip: Secondary | ICD-10-CM | POA: Diagnosis not present

## 2018-06-07 DIAGNOSIS — L821 Other seborrheic keratosis: Secondary | ICD-10-CM | POA: Diagnosis not present

## 2018-06-07 DIAGNOSIS — C44311 Basal cell carcinoma of skin of nose: Secondary | ICD-10-CM | POA: Diagnosis not present

## 2018-06-14 DIAGNOSIS — Z85828 Personal history of other malignant neoplasm of skin: Secondary | ICD-10-CM | POA: Diagnosis not present

## 2018-06-14 DIAGNOSIS — C44311 Basal cell carcinoma of skin of nose: Secondary | ICD-10-CM | POA: Diagnosis not present

## 2018-06-19 NOTE — Progress Notes (Signed)
Histology and Location of Primary Skin Cancer:  Right nasal bridge, basal cell carcinoma  Briana Hull presented with the following signs/symptoms:   Past/Anticipated interventions by patient's surgeon/dermatologist for current problematic lesion, if any:  Dr. Danny Lawless performed a right nasal bridge shave biopsy.  06/14/18 Dr. Danny Lawless:   Past skin cancers, if any: None  1) Location/Histology/Intervention:   2) Location/Histology/Intervention:   3) Location/Histology/Intervention:   History of Blistering sunburns, if any: Remotely.   SAFETY ISSUES:  Prior radiation? No  Pacemaker/ICD? No  Possible current pregnancy? No  Is the patient on methotrexate? No  Current Complaints / other details:

## 2018-06-21 ENCOUNTER — Telehealth: Payer: Self-pay | Admitting: *Deleted

## 2018-06-21 NOTE — Telephone Encounter (Signed)
Oncology Nurse Navigator Documentation  Placed introductory call to new referral patient Ms. Theisen.  Introduced myself as the H&N oncology nurse navigator that works with Dr. Isidore Moos to whom she has been referred by Dr. Sarajane Jews, [facility]. She confirmed understanding of referral, WebEx consult scheduled  for tomorrow morning 8:00 following 7:30 ttelephone NE.  She stated her sister will be joining her.  Briefly explained my role as his/her navigator, provided my contact information.   She voiced understanding I will be participating in the WebEx tomorrow.   Navigator Initial Assessment . Employment Status/FMLA/STD:  Works for [employer] in [employer].  Flexible hours for appts. . Support System: Lives alone, sister lives in [place],  . PCP:  Dr. Osborne Casco . PCD: . Transportation Needs: No . Sensory Deficits/Language Barriers/Interpreter Needed:  No . Ambulation Needs: No . DME Used in Home: No . Psychosocial Needs:  No . Concerns/Needs Understanding Cancer:  :I want to get this done right." . Self-Expressed Needs: No  Gayleen Orem, RN, BSN Head & Neck Oncology Nurse [facility] [phone]

## 2018-06-22 ENCOUNTER — Other Ambulatory Visit: Payer: Self-pay

## 2018-06-22 ENCOUNTER — Encounter: Payer: Self-pay | Admitting: *Deleted

## 2018-06-22 ENCOUNTER — Encounter: Payer: Self-pay | Admitting: Radiation Oncology

## 2018-06-22 ENCOUNTER — Ambulatory Visit
Admission: RE | Admit: 2018-06-22 | Discharge: 2018-06-22 | Disposition: A | Discharge status: Home / Self Care | Payer: PPO | Source: Ambulatory Visit | Attending: Radiation Oncology | Admitting: Radiation Oncology

## 2018-06-22 DIAGNOSIS — C44311 Basal cell carcinoma of skin of nose: Secondary | ICD-10-CM

## 2018-06-22 DIAGNOSIS — Z808 Family history of malignant neoplasm of other organs or systems: Secondary | ICD-10-CM | POA: Diagnosis not present

## 2018-06-22 NOTE — Progress Notes (Signed)
Radiation Oncology         [phone] ________________________________  Initial WEB EX Consultation  Name: Briana Hull MRN: [id number]  Date: 06/22/2018  DOB: 08/01/1950  PI:RJJOACZ, Fransico Him, MD  Griselda Miner, MD   REFERRING PHYSICIAN: Griselda Miner, MD  DIAGNOSIS:    ICD-10-CM   1. Basal cell carcinoma of nose C44.311 BUN & Creatinine ([facility])    CT Soft Tissue Neck W Contrast   Cancer Staging Basal cell carcinoma of nose Staging form: Cutaneous Carcinoma of the Head and Neck, AJCC 8th Edition - Clinical stage from 06/22/2018: Stage II (cT2, cN0, cM0) - Signed by Eppie Gibson, MD on 06/22/2018   CHIEF COMPLAINT: Here to discuss management of nose cancer  HISTORY OF PRESENT ILLNESS::Briana Hull is a 68 y.o. female who presented with a nose lesions.   Dr. Sarajane Jews performed a right nasal bridge shave biopsy.  Per 06/14/18 note by Dr. Sarajane Jews:   Past skin cancers, if any: None   History of Blistering sunburns, if any: Remotely.   SAFETY ISSUES:  Prior radiation? No  Pacemaker/ICD? No  Possible current pregnancy? No  Is the patient on methotrexate? No  Current Complaints / other details:  She works in [employer], interested in good cosmetic outcome. Denies smoking.  Multiple family cancers, various types.   Reports bumpy and sore feeling in back of throat and left nosebleeds.  Reports discomfort in left submandibular area.  PREVIOUS RADIATION THERAPY: No  PAST MEDICAL HISTORY:  has a past medical history of Allergic asthma, Cancer ([place]), Degenerative joint disease, Duodenal ulcer, Herpes simplex of female genitalia, History of chicken pox, Hypertension, Hypoglycemia, Migraine, and Seasonal allergies.    PAST SURGICAL HISTORY: Past Surgical History:  Procedure Laterality Date  . CATARACT EXTRACTION W/ INTRAOCULAR LENS  IMPLANT, BILATERAL  2007/2018   Bil  . COLONOSCOPY    . COSMETIC SURGERY     Patient reports plastic surgery to neck, eyes, nose  in the past.   . LIPOSUCTION  2001  . ROOT CANAL    . TONSILLECTOMY  1978  . TOOTH EXTRACTION      FAMILY HISTORY: family history includes Brain cancer in her brother; Breast cancer (age of onset: 28) in her mother; Cancer in her father and mother; Cancer - Other in her maternal aunt; Diabetes in her brother; Heart attack in her maternal aunt; Heart disease in her maternal aunt and maternal grandmother; Hypertension in her mother; Lung cancer in her brother and maternal aunt; Lung cancer (age of onset: 41) in her father; Melanoma in her father and paternal aunt; Multiple myeloma in her paternal aunt; Ovarian cancer in her sister; Rectal cancer in her cousin; Renal cancer in her father; Stomach cancer in her paternal grandfather; Stroke in her mother.  SOCIAL HISTORY:  reports that she quit smoking about 36 years ago. She has never used smokeless tobacco. She reports current alcohol use. She reports that she does not use drugs.  ALLERGIES: Other; Codeine; and Hydrocodone  MEDICATIONS:  Current Outpatient Medications  Medication Sig Dispense Refill  . albuterol (PROVENTIL HFA;VENTOLIN HFA) 108 (90 BASE) MCG/ACT inhaler Inhale 1-2 puffs into the lungs every 6 (six) hours as needed for wheezing or shortness of breath. 1 Inhaler 2  . atenolol (TENORMIN) 25 MG tablet TAKE 1 TABLET (25 MG TOTAL) BY MOUTH DAILY. 90 tablet 2  . Cholecalciferol (VITAMIN D) 2000 units CAPS Take 2 capsules by mouth.    . fluticasone (FLONASE) 50 MCG/ACT nasal spray Place 2  sprays into both nostrils daily as needed for allergies or rhinitis. (Patient not taking: Reported on 06/22/2018) 16 g 5  . valACYclovir (VALTREX) 500 MG tablet TAKE 1 TABLET BY MOUTH TWICE A DAY (Patient not taking: Reported on 11/15/2017) 30 tablet 2   No current facility-administered medications for this encounter.     REVIEW OF SYSTEMS:  Notable for that above.   PHYSICAL EXAM:  vitals were not taken for this visit.   General: Alert and  oriented, in no acute distress HEENT: Head is normocephalic. Extraocular movements are intact.   Psychiatric: Judgment and insight are intact. Affect is appropriate.  Photo sent by patient from appt w/ Dr Kennon Rounds = 0  0 - Asymptomatic (Fully active, able to carry on all predisease activities without restriction)  1 - Symptomatic but completely ambulatory (Restricted in physically strenuous activity but ambulatory and able to carry out work of a light or sedentary nature. For example, light housework, office work)  2 - Symptomatic, <50% in bed during the day (Ambulatory and capable of all self care but unable to carry out any work activities. Up and about more than 50% of waking hours)  3 - Symptomatic, >50% in bed, but not bedbound (Capable of only limited self-care, confined to bed or chair 50% or more of waking hours)  4 - Bedbound (Completely disabled. Cannot carry on any self-care. Totally confined to bed or chair)  5 - Death   Eustace Pen MM, Creech RH, Tormey DC, et al. [phone]). "Toxicity and response criteria of the [facility]". [place] Oncol. 5 (6): 649-55   LABORATORY DATA:  Lab Results  Component Value Date   WBC 6.1 12/22/2015   HGB 14.3 12/22/2015   HCT 41.6 12/22/2015   MCV 93.7 12/22/2015   PLT 262.0 12/22/2015   CMP     Component Value Date/Time   NA 140 12/22/2015 0901   NA 142 09/08/2015 1020   K 5.0 12/22/2015 0901   CL 106 12/22/2015 0901   CO2 29 12/22/2015 0901   GLUCOSE 105 (H) 12/22/2015 0901   BUN 13 12/22/2015 0901   BUN 12 09/08/2015 1020   CREATININE 0.90 12/22/2015 0901   CALCIUM 9.7 12/22/2015 0901   PROT 6.5 12/22/2015 0901   PROT 6.7 09/08/2015 1020   ALBUMIN 4.2 12/22/2015 0901   ALBUMIN 4.5 09/08/2015 1020   AST 16 12/22/2015 0901   ALT 22 12/22/2015 0901   ALKPHOS 58 12/22/2015 0901   BILITOT 0.6 12/22/2015 0901   BILITOT 0.6 09/08/2015 1020   GFRNONAA 70 09/08/2015 1020   GFRAA 81 09/08/2015 1020       Lab Results  Component Value Date   TSH 0.78 08/24/2015     RADIOGRAPHY: No results found.    IMPRESSION/PLAN:  This is a delightful patient w/ basal cell carcinoma of the nose.  Today, I talked to the patient about the findings and work-up thus far. We discussed the patient's diagnosis of skin cancer and general treatment for this, highlighting the role of radiotherapy in the management. We discussed the available radiation techniques, and focused on the details of logistics and delivery.    We discussed the risks, benefits, and side effects of radiotherapy. She understands surgery is the other curative option.  I estimate a 90% chance of local control with RT. Side effects may include but not necessarily be limited to: skin irritation, fatigue, nostril irritation and nose hair loss, permanent skin irritation, rare cartilage necrosis.  No guarantees of treatment were given   The patient was encouraged to ask questions that I answered to the best of my ability.   CT of neck ordered given symptoms reported in narrative.  Simulation (treatment planning) will take place after CT scan. She is intent of proceeding. We discussed a 4 week hypofractionated course vs 6 week standard course - she would like to proceed with the 6 week course which may have a small benefit for cosmesis.  This encounter was provided by telemedicine platform Webex due to the pandemic.  The patient has given verbal consent for this type of encounter and has been advised to only accept a meeting of this type in a secure network environment. The time spent during this encounter was 38 minutes. The attendants for this meeting include Eppie Gibson  and Elissa Hefty.  Her sister was present too, as well as Gayleen Orem, RN, our Head and Neck Oncology Navigator  During the encounter, Eppie Gibson was located at [facility] Radiation Oncology Department.  Elissa Hefty was located at home.    __________________________________________   Eppie Gibson, MD

## 2018-06-23 NOTE — Progress Notes (Signed)
Oncology Nurse Navigator Documentation  Met with patient during WebEx consult with Dr. Squire to discuss RT for nasal bridge BCC.  She was accompanied by her sister Debbie.   . Further introduced myself as her Navigator, explained my role as a member of the Care Team.   . Provided introductory explanation of radiation treatment including SIM planning and purpose of Aquaplast head and shoulder mask, showed them example.   . She reported:  First addressed concerns about "spot" about 2 yrs ago.  Had bx 2009.  Recently met with Dr. Goodrich, [place] to discuss Mohs' tmt.  2-3 minimal nose bleeds in past weeks. . She voiced understanding of: . Options of 4 or 6 weeks tmt, she opted for 6 weeks to minimize cosmetic SEs. . Dr. Squire to order CT Neck after which she will be scheduled for CT SIM. . I encouraged her to call me with questions/concerns as treatments/procedures begin.  She verbalized understanding of information provided.    Rick Diehl, RN, BSN Head & Neck Oncology Nurse Navigator [facility] [place] [phone]   

## 2018-06-27 ENCOUNTER — Telehealth: Payer: Self-pay | Admitting: *Deleted

## 2018-06-27 NOTE — Telephone Encounter (Signed)
CALLED PATIENT TO INFORM OF STAT LABS ON 06-29-18 - ARRIVAL TIME- 12:45 PM @ [facility] ON 06-29-18 - ARRIVAL TIME - 1:45 PM @ [facility], PT. TO HAVE WATER ONLY- 4 HRS. PRIOR TO TEST, SPOKE WITH PATIENT AND SHE IS AWARE OF THESE APPTS.

## 2018-06-28 ENCOUNTER — Telehealth: Payer: Self-pay | Admitting: *Deleted

## 2018-06-28 NOTE — Telephone Encounter (Signed)
CALLED PATIENT TO INFORM OF SIM APPT. FOR 07-02-18 @ 8:30 AM, LVM FOR A RETURN CALL

## 2018-06-28 NOTE — Telephone Encounter (Signed)
Called patient to inform of [facility] APPT.and sim appt. for 07-02-18, spoke with patient and she is aware of these appts.

## 2018-06-29 ENCOUNTER — Other Ambulatory Visit: Payer: Self-pay

## 2018-06-29 ENCOUNTER — Ambulatory Visit
Admission: RE | Admit: 2018-06-29 | Discharge: 2018-06-29 | Disposition: A | Discharge status: Home / Self Care | Payer: PPO | Source: Ambulatory Visit | Attending: Radiation Oncology | Admitting: Radiation Oncology

## 2018-06-29 ENCOUNTER — Ambulatory Visit (HOSPITAL_COMMUNITY)
Admission: RE | Admit: 2018-06-29 | Discharge: 2018-06-29 | Disposition: A | Discharge status: Home / Self Care | Payer: PPO | Source: Ambulatory Visit | Attending: Radiation Oncology | Admitting: Radiation Oncology

## 2018-06-29 DIAGNOSIS — C44311 Basal cell carcinoma of skin of nose: Secondary | ICD-10-CM | POA: Insufficient documentation

## 2018-06-29 LAB — BUN & CREATININE (CHCC)
BUN: 14 mg/dL (ref 8–23)
Creatinine: 0.82 mg/dL (ref 0.44–1.00)
GFR, Est AFR Am: >60 mL/min (ref >60)
GFR, Estimated: >60 mL/min (ref >60)

## 2018-06-29 MED ORDER — SODIUM CHLORIDE (PF) 0.9 % IJ SOLN
INTRAMUSCULAR | Status: AC
  Filled 2018-06-29: qty 50

## 2018-06-29 MED ORDER — IOHEXOL 300 MG/ML  SOLN
75.0000 mL | Freq: Once | INTRAMUSCULAR | Status: AC | PRN
  Administered 2018-06-29: 75 mL via INTRAVENOUS

## 2018-07-02 ENCOUNTER — Ambulatory Visit
Admission: RE | Admit: 2018-07-02 | Discharge: 2018-07-02 | Disposition: A | Discharge status: Home / Self Care | Payer: PPO | Source: Ambulatory Visit | Attending: Radiation Oncology | Admitting: Radiation Oncology

## 2018-07-02 ENCOUNTER — Other Ambulatory Visit: Payer: Self-pay

## 2018-07-02 ENCOUNTER — Encounter: Payer: Self-pay | Admitting: Radiation Oncology

## 2018-07-02 DIAGNOSIS — C44311 Basal cell carcinoma of skin of nose: Secondary | ICD-10-CM | POA: Diagnosis not present

## 2018-07-02 NOTE — Progress Notes (Signed)
Radiation Oncology         [phone] ________________________________  Name: Briana Hull MRN: [id number]  Date: 07/02/2018  DOB: May 24, 1950  Follow-Up Visit Note  Outpatient  CC: Tisovec, Fransico Him, MD  Tisovec, Fransico Him, MD  Diagnosis and Prior Radiotherapy:    ICD-10-CM   1. 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  She continues to work for a [employer].  She reports stress related to her job.  I spoke with Dr. Sarajane Jews and reviewed the markings that he had made in a photograph from his consultation with the patient to verify the borders of her tumor.                           Fortunately the patient's CT of her neck was negative.  I reviewed a hard copy of patient's path report which showed basal cell carcinoma with sclerosis -biopsy was obtained on 06/07/2018 from the inferior central nasal bridge  ALLERGIES:  is allergic to other; codeine; and hydrocodone.  Meds: Current Outpatient Medications  Medication Sig Dispense Refill   albuterol (PROVENTIL HFA;VENTOLIN HFA) 108 (90 BASE) MCG/ACT inhaler Inhale 1-2 puffs into the lungs every 6 (six) hours as needed for wheezing or shortness of breath. 1 Inhaler 2   atenolol (TENORMIN) 25 MG tablet TAKE 1 TABLET (25 MG TOTAL) BY MOUTH DAILY. 90 tablet 2   Cholecalciferol (VITAMIN D) 2000 units CAPS Take 2 capsules by mouth.     fluticasone (FLONASE) 50 MCG/ACT nasal spray Place 2 sprays into both nostrils daily as needed for allergies or rhinitis. (Patient not taking: Reported on 06/22/2018) 16 g 5   valACYclovir (VALTREX) 500 MG tablet TAKE 1 TABLET BY MOUTH TWICE A  DAY (Patient not taking: Reported on 11/15/2017) 30 tablet 2   No current facility-administered medications for this encounter.     Physical Findings: The patient is in no acute distress. Patient is alert and oriented.  weight is 210 lb 9.6 oz (95.5 kg). Her oral temperature is 98.2 F (36.8 C). Her blood pressure is 142/94 (abnormal) and her pulse is 60. Her oxygen saturation is 100%. .    There is a erythematous lesion over the bridge of her nose that is over 2 cm in dimension without any crusting or bleeding.  The borders are poorly defined.  The lesion is somewhat subtle for a basal cell carcinoma without any pearly papular appearance.  See path report above   Lab Findings: Lab Results  Component Value Date   WBC 6.1 12/22/2015   HGB 14.3 12/22/2015   HCT 41.6 12/22/2015   MCV 93.7 12/22/2015   PLT 262.0 12/22/2015    Radiographic Findings: Ct Soft Tissue Neck W Contrast  Result Date: 06/29/2018 CLINICAL DATA:  Recent diagnosis of basal cell carcinoma of the nose. Left-sided neck pain. EXAM: CT NECK WITH CONTRAST TECHNIQUE: Multidetector CT imaging of the neck was performed using the standard protocol following the bolus administration of intravenous contrast. CONTRAST:  28mL OMNIPAQUE IOHEXOL 300 MG/ML  SOLN COMPARISON:  None. FINDINGS: Pharynx and larynx: No mucosal or submucosal  lesion. Salivary glands: Parotid and submandibular glands are normal. Thyroid: Normal Lymph nodes: No enlarged or low-density lymph nodes on either side of the neck. Normal bilateral nodes. Vascular: Mild atherosclerotic change at the right carotid bifurcation. No venous finding. Limited intracranial: Normal Visualized orbits: Not included Mastoids and visualized paranasal sinuses: Clear except for insignificant retention cyst at the floor of the right maxillary sinus. Skeleton: Ordinary cervical spondylosis. Upper chest: Normal Other: None. Skin marker overlying the area of concern overlies normal  subcutaneous fat and the sternocleidomastoid muscle. IMPRESSION: Negative study. No abnormality seen at the area marked on the left. No sign of adenopathy or mass. Electronically Signed   By: Nelson Chimes M.D.   On: 06/29/2018 19:36    Impression/Plan: We again reviewed the risks benefits and side effects of radiotherapy to the nose.  We talked about the alternative of surgery.  She is not interested in the potential morbidity of surgery.  She understands that there are no guarantees to treatment and she understands that radiation therapy carries a risk of skin irritation, irritation to the mucosa of the nostrils, hair loss in the nostrils , bleeding, and permanent changes in the pigment of the skin as well as possible telangiectasias.   Severe injury to the cartilage of the nose is quite unusual.  Overall I do feel that the potential morbidity of radiotherapy is less than the potential morbidity of surgery.  She would like to proceed with simulation which we will perform today.  Anticipate 6 weeks of electron therapy to the nose.  Due to significant social stress I offered a social work Land.  She declines that today.  I spent 10 minutes face to face with the patient and more than 50% of that time was spent in counseling and/or coordination of care. _____________________________________   Eppie Gibson, MD

## 2018-07-03 ENCOUNTER — Encounter: Payer: Self-pay | Admitting: Radiation Oncology

## 2018-07-03 NOTE — Progress Notes (Signed)
  Radiation Oncology         [phone] ________________________________  Name: SANSKRITI GREENLAW MRN: [id number]  Date: 07/02/2018  DOB: 06/23/1950  SIMULATION AND TREATMENT PLANNING NOTE  Outpatient  DIAGNOSIS:     ICD-10-CM   1. 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  Identity was confirmed.  All relevant records and images related to the planned course of therapy were reviewed.  The patient freely provided informed written consent to proceed with treatment after reviewing the details related to the planned course of therapy. The consent form was witnessed and verified by the simulation staff.    Then, the patient was set-up in a stable reproducible  supine position for radiation therapy.  Customized head mask was made for immobilization with lead blocks over the eyes/ face. I drew around her tumor with marker.   TREATMENT PLANNING NOTE: Patient was taken to [facility]. Treatment planning then occurred. En face positioning of beam was determined. The radiation prescription was entered and confirmed.    A total of 2 medically necessary complex treatment devices were fabricated and supervised by me, in the form of head mask and customized electron cut out.  The patient will receive 60 Gy in 30 fractions to the nose with electrons and bolus.   -----------------------------------  Eppie Gibson, MD

## 2018-07-04 DIAGNOSIS — C44311 Basal cell carcinoma of skin of nose: Secondary | ICD-10-CM | POA: Diagnosis not present

## 2018-07-09 ENCOUNTER — Ambulatory Visit
Admission: RE | Admit: 2018-07-09 | Discharge: 2018-07-09 | Disposition: A | Discharge status: Home / Self Care | Payer: PPO | Source: Ambulatory Visit | Attending: Radiation Oncology | Admitting: Radiation Oncology

## 2018-07-09 ENCOUNTER — Other Ambulatory Visit: Payer: Self-pay

## 2018-07-09 DIAGNOSIS — C44311 Basal cell carcinoma of skin of nose: Secondary | ICD-10-CM | POA: Diagnosis not present

## 2018-07-09 MED ORDER — SONAFINE EX EMUL: 1.0000 "application " | Freq: Once | CUTANEOUS | Status: DC

## 2018-07-09 NOTE — Progress Notes (Signed)
Pt here for patient teaching.  Pt given Radiation and You booklet, skin care instructions and Sonafine.  Reviewed areas of pertinence such as fatigue and skin changes . Pt able to give teach back of to pat skin, use unscented/gentle soap and drink plenty of water,apply Sonafine bid and avoid applying anything to skin within 4 hours of treatment. Pt verbalizes understanding of information given and will contact nursing with any questions or concerns.     [email or web address]/[email or web address]

## 2018-07-10 ENCOUNTER — Other Ambulatory Visit: Payer: Self-pay

## 2018-07-10 ENCOUNTER — Ambulatory Visit
Admission: RE | Admit: 2018-07-10 | Discharge: 2018-07-10 | Disposition: A | Discharge status: Home / Self Care | Payer: PPO | Source: Ambulatory Visit | Attending: Radiation Oncology | Admitting: Radiation Oncology

## 2018-07-10 DIAGNOSIS — C44311 Basal cell carcinoma of skin of nose: Secondary | ICD-10-CM | POA: Diagnosis not present

## 2018-07-11 ENCOUNTER — Ambulatory Visit
Admission: RE | Admit: 2018-07-11 | Discharge: 2018-07-11 | Disposition: A | Discharge status: Home / Self Care | Payer: PPO | Source: Ambulatory Visit | Attending: Radiation Oncology | Admitting: Radiation Oncology

## 2018-07-11 ENCOUNTER — Other Ambulatory Visit: Payer: Self-pay

## 2018-07-11 DIAGNOSIS — C44311 Basal cell carcinoma of skin of nose: Secondary | ICD-10-CM | POA: Diagnosis not present

## 2018-07-12 ENCOUNTER — Ambulatory Visit
Admission: RE | Admit: 2018-07-12 | Discharge: 2018-07-12 | Disposition: A | Discharge status: Home / Self Care | Payer: PPO | Source: Ambulatory Visit | Attending: Radiation Oncology | Admitting: Radiation Oncology

## 2018-07-12 ENCOUNTER — Other Ambulatory Visit: Payer: Self-pay

## 2018-07-12 DIAGNOSIS — C44311 Basal cell carcinoma of skin of nose: Secondary | ICD-10-CM | POA: Diagnosis not present

## 2018-07-13 ENCOUNTER — Ambulatory Visit
Admission: RE | Admit: 2018-07-13 | Discharge: 2018-07-13 | Disposition: A | Discharge status: Home / Self Care | Payer: PPO | Source: Ambulatory Visit | Attending: Radiation Oncology | Admitting: Radiation Oncology

## 2018-07-13 ENCOUNTER — Other Ambulatory Visit: Payer: Self-pay

## 2018-07-13 DIAGNOSIS — C44311 Basal cell carcinoma of skin of nose: Secondary | ICD-10-CM | POA: Diagnosis not present

## 2018-07-16 ENCOUNTER — Ambulatory Visit
Admission: RE | Admit: 2018-07-16 | Discharge: 2018-07-16 | Disposition: A | Discharge status: Home / Self Care | Payer: PPO | Source: Ambulatory Visit | Attending: Radiation Oncology | Admitting: Radiation Oncology

## 2018-07-16 ENCOUNTER — Other Ambulatory Visit: Payer: Self-pay

## 2018-07-16 ENCOUNTER — Other Ambulatory Visit: Payer: Self-pay | Admitting: Radiation Oncology

## 2018-07-16 DIAGNOSIS — C44311 Basal cell carcinoma of skin of nose: Secondary | ICD-10-CM

## 2018-07-17 ENCOUNTER — Other Ambulatory Visit: Payer: Self-pay

## 2018-07-17 ENCOUNTER — Ambulatory Visit
Admission: RE | Admit: 2018-07-17 | Discharge: 2018-07-17 | Disposition: A | Discharge status: Home / Self Care | Payer: PPO | Source: Ambulatory Visit | Attending: Radiation Oncology | Admitting: Radiation Oncology

## 2018-07-17 DIAGNOSIS — C44311 Basal cell carcinoma of skin of nose: Secondary | ICD-10-CM | POA: Diagnosis not present

## 2018-07-18 ENCOUNTER — Ambulatory Visit
Admission: RE | Admit: 2018-07-18 | Discharge: 2018-07-18 | Disposition: A | Discharge status: Home / Self Care | Payer: PPO | Source: Ambulatory Visit | Attending: Radiation Oncology | Admitting: Radiation Oncology

## 2018-07-18 ENCOUNTER — Other Ambulatory Visit: Payer: Self-pay

## 2018-07-18 DIAGNOSIS — C44311 Basal cell carcinoma of skin of nose: Secondary | ICD-10-CM | POA: Diagnosis not present

## 2018-07-19 ENCOUNTER — Ambulatory Visit
Admission: RE | Admit: 2018-07-19 | Discharge: 2018-07-19 | Disposition: A | Discharge status: Home / Self Care | Payer: PPO | Source: Ambulatory Visit | Attending: Radiation Oncology | Admitting: Radiation Oncology

## 2018-07-19 ENCOUNTER — Other Ambulatory Visit: Payer: Self-pay

## 2018-07-19 ENCOUNTER — Telehealth: Payer: Self-pay | Admitting: *Deleted

## 2018-07-19 DIAGNOSIS — C44311 Basal cell carcinoma of skin of nose: Secondary | ICD-10-CM | POA: Diagnosis not present

## 2018-07-19 NOTE — Telephone Encounter (Signed)
[place] Work  Clinical Social Work was referred by radiation for assessment of psychosocial needs.  Clinical Social Worker contacted patient by phone  to offer support and assess for needs.   [name] reported her main concern was addressed medical bills of radiation treaments.  CSW inquired whether patient spoke with Ailene Ravel, financial advocate.  Patient reports she thinks she did.  CSW validated patient's feelings of frustration and explored resources.  CSW encouraged patient to follow up with Cancer [facility].  CSW provided information virtual support programs and patient was very interested.  CSW emailed information and instructions on how to register.  Briana Maine, Briana Hull  Clinical Social Worker [facility]

## 2018-07-20 ENCOUNTER — Ambulatory Visit
Admission: RE | Admit: 2018-07-20 | Discharge: 2018-07-20 | Disposition: A | Discharge status: Home / Self Care | Payer: PPO | Source: Ambulatory Visit | Attending: Radiation Oncology | Admitting: Radiation Oncology

## 2018-07-20 ENCOUNTER — Other Ambulatory Visit: Payer: Self-pay

## 2018-07-20 DIAGNOSIS — C44311 Basal cell carcinoma of skin of nose: Secondary | ICD-10-CM | POA: Diagnosis not present

## 2018-07-23 ENCOUNTER — Ambulatory Visit
Admission: RE | Admit: 2018-07-23 | Discharge: 2018-07-23 | Disposition: A | Discharge status: Home / Self Care | Payer: PPO | Source: Ambulatory Visit | Attending: Radiation Oncology | Admitting: Radiation Oncology

## 2018-07-23 ENCOUNTER — Other Ambulatory Visit: Payer: Self-pay

## 2018-07-23 DIAGNOSIS — C44311 Basal cell carcinoma of skin of nose: Secondary | ICD-10-CM | POA: Diagnosis not present

## 2018-07-24 ENCOUNTER — Ambulatory Visit
Admission: RE | Admit: 2018-07-24 | Discharge: 2018-07-24 | Disposition: A | Discharge status: Home / Self Care | Payer: PPO | Source: Ambulatory Visit | Attending: Radiation Oncology | Admitting: Radiation Oncology

## 2018-07-24 ENCOUNTER — Other Ambulatory Visit: Payer: Self-pay

## 2018-07-24 DIAGNOSIS — C44311 Basal cell carcinoma of skin of nose: Secondary | ICD-10-CM | POA: Diagnosis not present

## 2018-07-25 ENCOUNTER — Other Ambulatory Visit: Payer: Self-pay

## 2018-07-25 ENCOUNTER — Ambulatory Visit
Admission: RE | Admit: 2018-07-25 | Discharge: 2018-07-25 | Disposition: A | Discharge status: Home / Self Care | Payer: PPO | Source: Ambulatory Visit | Attending: Radiation Oncology | Admitting: Radiation Oncology

## 2018-07-25 DIAGNOSIS — C44311 Basal cell carcinoma of skin of nose: Secondary | ICD-10-CM | POA: Diagnosis not present

## 2018-07-26 ENCOUNTER — Ambulatory Visit
Admission: RE | Admit: 2018-07-26 | Discharge: 2018-07-26 | Disposition: A | Discharge status: Home / Self Care | Payer: PPO | Source: Ambulatory Visit | Attending: Radiation Oncology | Admitting: Radiation Oncology

## 2018-07-26 ENCOUNTER — Other Ambulatory Visit: Payer: Self-pay

## 2018-07-26 DIAGNOSIS — C44311 Basal cell carcinoma of skin of nose: Secondary | ICD-10-CM | POA: Diagnosis not present

## 2018-07-27 ENCOUNTER — Ambulatory Visit
Admission: RE | Admit: 2018-07-27 | Discharge: 2018-07-27 | Disposition: A | Discharge status: Home / Self Care | Payer: PPO | Source: Ambulatory Visit | Attending: Radiation Oncology | Admitting: Radiation Oncology

## 2018-07-27 ENCOUNTER — Other Ambulatory Visit: Payer: Self-pay

## 2018-07-27 DIAGNOSIS — C44311 Basal cell carcinoma of skin of nose: Secondary | ICD-10-CM | POA: Diagnosis not present

## 2018-07-30 ENCOUNTER — Ambulatory Visit
Admission: RE | Admit: 2018-07-30 | Discharge: 2018-07-30 | Disposition: A | Discharge status: Home / Self Care | Payer: PPO | Source: Ambulatory Visit | Attending: Radiation Oncology | Admitting: Radiation Oncology

## 2018-07-30 ENCOUNTER — Other Ambulatory Visit: Payer: Self-pay

## 2018-07-30 DIAGNOSIS — C44311 Basal cell carcinoma of skin of nose: Secondary | ICD-10-CM | POA: Diagnosis not present

## 2018-07-31 ENCOUNTER — Encounter: Payer: Self-pay | Admitting: *Deleted

## 2018-07-31 ENCOUNTER — Ambulatory Visit
Admission: RE | Admit: 2018-07-31 | Discharge: 2018-07-31 | Disposition: A | Discharge status: Home / Self Care | Payer: PPO | Source: Ambulatory Visit | Attending: Radiation Oncology | Admitting: Radiation Oncology

## 2018-07-31 ENCOUNTER — Other Ambulatory Visit: Payer: Self-pay

## 2018-07-31 DIAGNOSIS — C44311 Basal cell carcinoma of skin of nose: Secondary | ICD-10-CM | POA: Diagnosis not present

## 2018-08-01 ENCOUNTER — Encounter: Payer: Self-pay | Admitting: *Deleted

## 2018-08-01 ENCOUNTER — Ambulatory Visit
Admission: RE | Admit: 2018-08-01 | Discharge: 2018-08-01 | Disposition: A | Discharge status: Home / Self Care | Payer: PPO | Source: Ambulatory Visit | Attending: Radiation Oncology | Admitting: Radiation Oncology

## 2018-08-01 ENCOUNTER — Other Ambulatory Visit: Payer: Self-pay

## 2018-08-01 DIAGNOSIS — C44311 Basal cell carcinoma of skin of nose: Secondary | ICD-10-CM | POA: Diagnosis not present

## 2018-08-01 NOTE — Progress Notes (Deleted)
A user error has taken place: encounter opened in error, closed for administrative reasons.

## 2018-08-01 NOTE — Progress Notes (Signed)
Oncology Nurse Navigator Documentation  In follow-up to pt's request during weekly PUT, prepared/e-mailed letter to patient from Dr. Isidore Moos requesting adjustment of pt's work schedule to hold Saturday work requirement to provide her full weekend to rest/recuperate from [employer] RT.  Gayleen Orem, RN, BSN Head & Neck Oncology Nurse [place] at [place] [phone]

## 2018-08-02 ENCOUNTER — Other Ambulatory Visit: Payer: Self-pay

## 2018-08-02 ENCOUNTER — Ambulatory Visit
Admission: RE | Admit: 2018-08-02 | Discharge: 2018-08-02 | Disposition: A | Discharge status: Home / Self Care | Payer: PPO | Source: Ambulatory Visit | Attending: Radiation Oncology | Admitting: Radiation Oncology

## 2018-08-02 DIAGNOSIS — C44311 Basal cell carcinoma of skin of nose: Secondary | ICD-10-CM | POA: Diagnosis not present

## 2018-08-02 NOTE — Progress Notes (Signed)
Oncology Nurse Navigator Documentation  Met with Briana Hull prior to her weekly PUT with Dr. Isidore Moos to check on her well-being. She reported toleration of tmts, acknowledged she has completed just over half of scheduled tmts. She noted increasing fatigue d/t RT and stated she had low energy level prior to start because of demanding work schedule.   She further elaborated her employer is requiring her to work half days Mon thru Sat and the schedule is not providing her sufficient time to rest before starting the next week's tmts. I encouraged her to share this with Dr. Isidore Moos, indicated I will prepare a letter from Dr. Isidore Moos asking that her employer temporarily eliminate the Saturday work requirement. She expressed appreciation for my support.  Gayleen Orem, RN, BSN Head & Neck Oncology Nurse [place] at [place] [phone]

## 2018-08-06 ENCOUNTER — Other Ambulatory Visit: Payer: Self-pay

## 2018-08-06 ENCOUNTER — Ambulatory Visit
Admission: RE | Admit: 2018-08-06 | Discharge: 2018-08-06 | Disposition: A | Discharge status: Home / Self Care | Payer: PPO | Source: Ambulatory Visit | Attending: Radiation Oncology | Admitting: Radiation Oncology

## 2018-08-06 DIAGNOSIS — C44311 Basal cell carcinoma of skin of nose: Secondary | ICD-10-CM | POA: Diagnosis not present

## 2018-08-07 ENCOUNTER — Other Ambulatory Visit: Payer: Self-pay

## 2018-08-07 ENCOUNTER — Ambulatory Visit
Admission: RE | Admit: 2018-08-07 | Discharge: 2018-08-07 | Disposition: A | Discharge status: Home / Self Care | Payer: PPO | Source: Ambulatory Visit | Attending: Radiation Oncology | Admitting: Radiation Oncology

## 2018-08-07 DIAGNOSIS — C44311 Basal cell carcinoma of skin of nose: Secondary | ICD-10-CM | POA: Diagnosis not present

## 2018-08-08 ENCOUNTER — Ambulatory Visit
Admission: RE | Admit: 2018-08-08 | Discharge: 2018-08-08 | Disposition: A | Discharge status: Home / Self Care | Payer: PPO | Source: Ambulatory Visit | Attending: Radiation Oncology | Admitting: Radiation Oncology

## 2018-08-08 ENCOUNTER — Other Ambulatory Visit: Payer: Self-pay

## 2018-08-08 DIAGNOSIS — C44311 Basal cell carcinoma of skin of nose: Secondary | ICD-10-CM | POA: Diagnosis not present

## 2018-08-09 ENCOUNTER — Ambulatory Visit
Admission: RE | Admit: 2018-08-09 | Discharge: 2018-08-09 | Disposition: A | Discharge status: Home / Self Care | Payer: PPO | Source: Ambulatory Visit | Attending: Radiation Oncology | Admitting: Radiation Oncology

## 2018-08-09 ENCOUNTER — Other Ambulatory Visit: Payer: Self-pay

## 2018-08-09 DIAGNOSIS — C44311 Basal cell carcinoma of skin of nose: Secondary | ICD-10-CM | POA: Diagnosis not present

## 2018-08-10 ENCOUNTER — Other Ambulatory Visit: Payer: Self-pay

## 2018-08-10 ENCOUNTER — Ambulatory Visit
Admission: RE | Admit: 2018-08-10 | Discharge: 2018-08-10 | Disposition: A | Discharge status: Home / Self Care | Payer: PPO | Source: Ambulatory Visit | Attending: Radiation Oncology | Admitting: Radiation Oncology

## 2018-08-10 DIAGNOSIS — C44311 Basal cell carcinoma of skin of nose: Secondary | ICD-10-CM | POA: Diagnosis not present

## 2018-08-13 ENCOUNTER — Ambulatory Visit
Admission: RE | Admit: 2018-08-13 | Discharge: 2018-08-13 | Disposition: A | Discharge status: Home / Self Care | Payer: PPO | Source: Ambulatory Visit | Attending: Radiation Oncology | Admitting: Radiation Oncology

## 2018-08-13 ENCOUNTER — Encounter: Payer: Self-pay | Admitting: *Deleted

## 2018-08-13 ENCOUNTER — Other Ambulatory Visit: Payer: Self-pay

## 2018-08-13 DIAGNOSIS — C44311 Basal cell carcinoma of skin of nose: Secondary | ICD-10-CM | POA: Diagnosis not present

## 2018-08-14 ENCOUNTER — Other Ambulatory Visit: Payer: Self-pay

## 2018-08-14 ENCOUNTER — Ambulatory Visit
Admission: RE | Admit: 2018-08-14 | Discharge: 2018-08-14 | Disposition: A | Discharge status: Home / Self Care | Payer: PPO | Source: Ambulatory Visit | Attending: Radiation Oncology | Admitting: Radiation Oncology

## 2018-08-14 DIAGNOSIS — C44311 Basal cell carcinoma of skin of nose: Secondary | ICD-10-CM | POA: Diagnosis not present

## 2018-08-14 NOTE — Progress Notes (Signed)
Oncology Nurse Navigator Documentation  Prepared letter for Dr. Isidore Moos to patient's employer requesting adjustment in work schedule and projected date for return to work.  Two copies placed in envelope for pt to pick up today when she arrives for RT.  Gayleen Orem, RN, BSN Head & Neck Oncology Nurse [place] at [place] [phone]

## 2018-08-15 ENCOUNTER — Other Ambulatory Visit: Payer: Self-pay

## 2018-08-15 ENCOUNTER — Ambulatory Visit
Admission: RE | Admit: 2018-08-15 | Discharge: 2018-08-15 | Disposition: A | Discharge status: Home / Self Care | Payer: PPO | Source: Ambulatory Visit | Attending: Radiation Oncology | Admitting: Radiation Oncology

## 2018-08-15 DIAGNOSIS — C44311 Basal cell carcinoma of skin of nose: Secondary | ICD-10-CM | POA: Diagnosis not present

## 2018-08-16 ENCOUNTER — Ambulatory Visit
Admission: RE | Admit: 2018-08-16 | Discharge: 2018-08-16 | Disposition: A | Discharge status: Home / Self Care | Payer: PPO | Source: Ambulatory Visit | Attending: Radiation Oncology | Admitting: Radiation Oncology

## 2018-08-16 ENCOUNTER — Other Ambulatory Visit: Payer: Self-pay

## 2018-08-16 DIAGNOSIS — C44311 Basal cell carcinoma of skin of nose: Secondary | ICD-10-CM | POA: Diagnosis not present

## 2018-08-17 ENCOUNTER — Ambulatory Visit
Admission: RE | Admit: 2018-08-17 | Discharge: 2018-08-17 | Disposition: A | Discharge status: Home / Self Care | Payer: PPO | Source: Ambulatory Visit | Attending: Radiation Oncology | Admitting: Radiation Oncology

## 2018-08-17 ENCOUNTER — Other Ambulatory Visit: Payer: Self-pay

## 2018-08-17 DIAGNOSIS — C44311 Basal cell carcinoma of skin of nose: Secondary | ICD-10-CM | POA: Diagnosis not present

## 2018-08-20 ENCOUNTER — Other Ambulatory Visit: Payer: Self-pay

## 2018-08-20 ENCOUNTER — Encounter: Payer: Self-pay | Admitting: Radiation Oncology

## 2018-08-20 ENCOUNTER — Ambulatory Visit
Admission: RE | Admit: 2018-08-20 | Discharge: 2018-08-20 | Disposition: A | Discharge status: Home / Self Care | Payer: PPO | Source: Ambulatory Visit | Attending: Radiation Oncology | Admitting: Radiation Oncology

## 2018-08-20 DIAGNOSIS — C44311 Basal cell carcinoma of skin of nose: Secondary | ICD-10-CM | POA: Diagnosis not present

## 2018-08-30 ENCOUNTER — Encounter: Payer: Self-pay | Admitting: *Deleted

## 2018-08-30 NOTE — Progress Notes (Signed)
Oncology Nurse Navigator Documentation  Flowsheet/spreadsheet update.  Rick Jeremi Losito, RN, BSN Head & Neck Oncology Nurse Navigator [facility] at [place] [phone]   

## 2018-08-31 NOTE — Progress Notes (Signed)
A user error has taken place: encounter opened in error, closed for administrative reasons.

## 2018-09-13 NOTE — Progress Notes (Signed)
  Patient Name: Briana Hull MRN: [id number] DOB: 1950-04-27 Referring Physician: Griselda Miner (Profile Not Attached) Date of Service: 08/20/2018 [facility] [place], [place]                                                        End Of Treatment Note  Diagnoses: C44.311-Basal cell carcinoma of skin of nose  Cancer Staging Basal cell carcinoma of nose Staging form: Cutaneous Carcinoma of the Head and Neck, AJCC 8th Edition - Clinical stage from 06/22/2018: Stage II (cT2, cN0, cM0) - Signed by Eppie Gibson, MD on 06/22/2018  Intent: Curative  Radiation Treatment Dates: 07/09/2018 through 08/20/2018 Site Technique Total Dose (Gy) Dose per Fx Completed Fx Beam Energies  Head & neck: HN_nose Complex 60/60 2 30/30 6E   Narrative: The patient tolerated radiation therapy relatively well. She experienced fatigue and some skin irritation with erythema over the nose; skin remained intact. She is applying Sonafine to the area, as well as Aquaphor to soothe the nostrils.  Plan: The patient will follow-up with radiation oncology in one month.  ________________________________________________  Eppie Gibson, MD  This document serves as a record of services personally performed by Eppie Gibson, MD. It was created on her behalf by Rae Lips, a trained medical scribe. The creation of this record is based on the scribe's personal observations and the provider's statements to them. This document has been checked and approved by the attending provider.

## 2018-09-20 ENCOUNTER — Encounter: Payer: Self-pay | Admitting: Radiation Oncology

## 2018-09-20 NOTE — Progress Notes (Signed)
Briana Hull presents for follow up of radiation completed 08/20/18 to her nose. She reports improvement in her energy level. Her radiation site has healed well. SHe continues to use sonafine twice daily. She is feeling well.  BP 135/71 (BP Location: Left Arm)   Pulse (!) 58   Temp 98.7 F (37.1 C) (Oral)   Resp 18   Wt 215 lb 3.2 oz (97.6 kg)   SpO2 98% Comment: room air  BMI 38.12 kg/m    Wt Readings from Last 3 Encounters:  09/24/18 215 lb 3.2 oz (97.6 kg)  07/02/18 210 lb 9.6 oz (95.5 kg)  11/29/17 213 lb (96.6 kg)

## 2018-09-21 ENCOUNTER — Ambulatory Visit: Payer: Self-pay | Admitting: Radiation Oncology

## 2018-09-24 ENCOUNTER — Ambulatory Visit
Admission: RE | Admit: 2018-09-24 | Discharge: 2018-09-24 | Disposition: A | Discharge status: Home / Self Care | Payer: PPO | Source: Ambulatory Visit | Attending: Radiation Oncology | Admitting: Radiation Oncology

## 2018-09-24 ENCOUNTER — Other Ambulatory Visit: Payer: Self-pay

## 2018-09-24 ENCOUNTER — Encounter: Payer: Self-pay | Admitting: Radiation Oncology

## 2018-09-24 VITALS — BP 135/71 | HR 58 | Temp 98.7°F | Resp 18 | Wt 215.2 lb

## 2018-09-24 DIAGNOSIS — C44311 Basal cell carcinoma of skin of nose: Secondary | ICD-10-CM

## 2018-09-24 DIAGNOSIS — I10 Essential (primary) hypertension: Secondary | ICD-10-CM | POA: Diagnosis not present

## 2018-09-24 HISTORY — DX: Personal history of irradiation: Z92.3

## 2018-09-25 ENCOUNTER — Encounter: Payer: Self-pay | Admitting: Radiation Oncology

## 2018-09-25 DIAGNOSIS — R7301 Impaired fasting glucose: Secondary | ICD-10-CM | POA: Diagnosis not present

## 2018-09-25 NOTE — Progress Notes (Signed)
Radiation Oncology         (336) [phone] ________________________________  Name: Briana Hull MRN: [id number]  Date: 09/24/2018  DOB: Apr 30, 1950  Follow-Up Visit Note in person  outpatient  CC: Tisovec, Fransico Him, MD  Griselda Miner, MD  Diagnosis and Prior Radiotherapy:    ICD-10-CM   1. Basal cell carcinoma of nose  C44.311     CHIEF COMPLAINT: Here for follow-up and surveillance of skin cancer  Narrative:  Ms. Stockburger presents for follow up of radiation completed 08/20/18 to her nose. She reports improvement in her energy level. Her radiation site has healed well. She continues to use sonafine twice daily. She is feeling well. She is working part time.  BP 135/71 (BP Location: Left Arm)   Pulse (!) 58   Temp 98.7 F (37.1 C) (Oral)   Resp 18   Wt 215 lb 3.2 oz (97.6 kg)   SpO2 98% Comment: room air  BMI 38.12 kg/m    Wt Readings from Last 3 Encounters:  09/24/18 215 lb 3.2 oz (97.6 kg)  07/02/18 210 lb 9.6 oz (95.5 kg)  11/29/17 213 lb (96.6 kg)                                ALLERGIES:  is allergic to other; codeine; and hydrocodone.  Meds: Current Outpatient Medications  Medication Sig Dispense Refill  . albuterol (PROVENTIL HFA;VENTOLIN HFA) 108 (90 BASE) MCG/ACT inhaler Inhale 1-2 puffs into the lungs every 6 (six) hours as needed for wheezing or shortness of breath. 1 Inhaler 2  . atenolol (TENORMIN) 25 MG tablet TAKE 1 TABLET (25 MG TOTAL) BY MOUTH DAILY. 90 tablet 2  . Cholecalciferol (VITAMIN D) 2000 units CAPS Take 2 capsules by mouth.    . fluticasone (FLONASE) 50 MCG/ACT nasal spray Place 2 sprays into both nostrils daily as needed for allergies or rhinitis. (Patient not taking: Reported on 06/22/2018) 16 g 5  . valACYclovir (VALTREX) 500 MG tablet TAKE 1 TABLET BY MOUTH TWICE A DAY (Patient not taking: Reported on 11/15/2017) 30 tablet 2   No current facility-administered medications for this encounter.     Physical Findings: The patient is in  no acute distress. Patient is alert and oriented.  weight is 215 lb 3.2 oz (97.6 kg). Her oral temperature is 98.7 F (37.1 C). Her blood pressure is 135/71 and her pulse is 58 (abnormal). Her respiration is 18 and oxygen saturation is 98%. Marland Kitchen     PICTURE TODAY:    PICTURE BEFORE Radiotherapy:   Lab Findings: Lab Results  Component Value Date   WBC 6.1 12/22/2015   HGB 14.3 12/22/2015   HCT 41.6 12/22/2015   MCV 93.7 12/22/2015   PLT 262.0 12/22/2015    Radiographic Findings: No results found.  Impression/Plan: She has some residual erythema over her nose but it appears that she has had a clinical response.  I expect the erythema will improve as she continues to heal from the radiotherapy.  I recommend that she follow-up closely with dermatology not only for full skin exam which she knows she is due for, but to follow her nose as well.  This lesion was atypical and difficult to diagnose until she was seen by a specialist in dermatology.  I will see her back in 6 months, earlier if needed.  I filled out some additional paperwork for her today regarding her gradual return to work.  She  knows to wear a hat whenever she is outside and I reiterated that in her paperwork.  I spent 15 minutes face to face with the patient and more than 50% of that time was spent in counseling and/or coordination of care. _____________________________________   Eppie Gibson, MD

## 2018-09-28 DIAGNOSIS — R82998 Other abnormal findings in urine: Secondary | ICD-10-CM | POA: Diagnosis not present

## 2018-10-02 DIAGNOSIS — E669 Obesity, unspecified: Secondary | ICD-10-CM | POA: Diagnosis not present

## 2018-10-02 DIAGNOSIS — G43909 Migraine, unspecified, not intractable, without status migrainosus: Secondary | ICD-10-CM | POA: Diagnosis not present

## 2018-10-02 DIAGNOSIS — K269 Duodenal ulcer, unspecified as acute or chronic, without hemorrhage or perforation: Secondary | ICD-10-CM | POA: Diagnosis not present

## 2018-10-02 DIAGNOSIS — Z1331 Encounter for screening for depression: Secondary | ICD-10-CM | POA: Diagnosis not present

## 2018-10-02 DIAGNOSIS — D126 Benign neoplasm of colon, unspecified: Secondary | ICD-10-CM | POA: Diagnosis not present

## 2018-10-02 DIAGNOSIS — B009 Herpesviral infection, unspecified: Secondary | ICD-10-CM | POA: Diagnosis not present

## 2018-10-02 DIAGNOSIS — Z0000 Encounter for general adult medical examination without abnormal findings: Secondary | ICD-10-CM | POA: Diagnosis not present

## 2018-10-02 DIAGNOSIS — Z1339 Encounter for screening examination for other mental health and behavioral disorders: Secondary | ICD-10-CM | POA: Diagnosis not present

## 2018-10-02 DIAGNOSIS — J302 Other seasonal allergic rhinitis: Secondary | ICD-10-CM | POA: Diagnosis not present

## 2018-10-02 DIAGNOSIS — I10 Essential (primary) hypertension: Secondary | ICD-10-CM | POA: Diagnosis not present

## 2018-10-02 DIAGNOSIS — K219 Gastro-esophageal reflux disease without esophagitis: Secondary | ICD-10-CM | POA: Diagnosis not present

## 2018-10-02 DIAGNOSIS — J4530 Mild persistent asthma, uncomplicated: Secondary | ICD-10-CM | POA: Diagnosis not present

## 2018-10-02 DIAGNOSIS — C44311 Basal cell carcinoma of skin of nose: Secondary | ICD-10-CM | POA: Diagnosis not present

## 2018-10-02 DIAGNOSIS — R7301 Impaired fasting glucose: Secondary | ICD-10-CM | POA: Diagnosis not present

## 2018-10-09 DIAGNOSIS — L578 Other skin changes due to chronic exposure to nonionizing radiation: Secondary | ICD-10-CM | POA: Diagnosis not present

## 2018-10-09 DIAGNOSIS — L918 Other hypertrophic disorders of the skin: Secondary | ICD-10-CM | POA: Diagnosis not present

## 2018-10-09 DIAGNOSIS — L821 Other seborrheic keratosis: Secondary | ICD-10-CM | POA: Diagnosis not present

## 2018-10-09 DIAGNOSIS — D485 Neoplasm of uncertain behavior of skin: Secondary | ICD-10-CM | POA: Diagnosis not present

## 2018-10-09 DIAGNOSIS — D2371 Other benign neoplasm of skin of right lower limb, including hip: Secondary | ICD-10-CM | POA: Diagnosis not present

## 2018-10-09 DIAGNOSIS — Z85828 Personal history of other malignant neoplasm of skin: Secondary | ICD-10-CM | POA: Diagnosis not present

## 2018-10-16 DIAGNOSIS — Z1212 Encounter for screening for malignant neoplasm of rectum: Secondary | ICD-10-CM | POA: Diagnosis not present

## 2018-11-06 ENCOUNTER — Ambulatory Visit: Payer: PPO | Admitting: Gastroenterology

## 2018-11-06 ENCOUNTER — Other Ambulatory Visit: Payer: Self-pay

## 2018-11-06 ENCOUNTER — Other Ambulatory Visit (INDEPENDENT_AMBULATORY_CARE_PROVIDER_SITE_OTHER): Payer: PPO

## 2018-11-06 ENCOUNTER — Encounter: Payer: Self-pay | Admitting: Gastroenterology

## 2018-11-06 VITALS — BP 160/92 | HR 72 | Temp 98.2°F | Ht 62.25 in | Wt 217.0 lb

## 2018-11-06 DIAGNOSIS — R1032 Left lower quadrant pain: Secondary | ICD-10-CM

## 2018-11-06 DIAGNOSIS — Z86010 Personal history of colonic polyps: Secondary | ICD-10-CM | POA: Diagnosis not present

## 2018-11-06 DIAGNOSIS — K5730 Diverticulosis of large intestine without perforation or abscess without bleeding: Secondary | ICD-10-CM | POA: Diagnosis not present

## 2018-11-06 LAB — BUN: BUN: 17 mg/dL (ref 6–23)

## 2018-11-06 LAB — CREATININE, SERUM: Creatinine, Ser: 0.85 mg/dL (ref 0.40–1.20)

## 2018-11-06 MED ORDER — GLYCOPYRROLATE 2 MG PO TABS: 2.0000 mg | ORAL_TABLET | Freq: Two times a day (BID) | ORAL | 3 refills | Status: DC

## 2018-11-06 NOTE — Patient Instructions (Addendum)
Your provider has requested that you go to the basement level for lab work before leaving today. Press "B" on the elevator. The lab is located at the first door on the left as you exit the elevator.  We have sent the following medications to your pharmacy for you to pick up at your convenience: glycopyrrolate.   You have been scheduled for a CT scan of the abdomen and pelvis at [facility] ([address]---this is in the same building as [employer]).   You are scheduled on 11/22/18 at 10:15am. You should arrive 15 minutes prior to your appointment time for registration. Please follow the written instructions below on the day of your exam:  WARNING: IF YOU ARE ALLERGIC TO IODINE/X-RAY DYE, PLEASE NOTIFY RADIOLOGY IMMEDIATELY AT [phone]! YOU WILL BE GIVEN A 13 HOUR PREMEDICATION PREP.  1) Do not eat or drink anything after 6:00am (4 hours prior to your test) 2) You have been given 2 bottles of oral contrast to drink. The solution may taste better if refrigerated, but do NOT add ice or any other liquid to this solution. Shake well before drinking.    Drink 1 bottle of contrast @ 8:00am (2 hours prior to your exam)  Drink 1 bottle of contrast @ 9:00am (1 hour prior to your exam)  You may take any medications as prescribed with a small amount of water, if necessary. If you take any of the following medications: METFORMIN, GLUCOPHAGE, GLUCOVANCE, AVANDAMET, RIOMET, FORTAMET, [place] MET, JANUMET, GLUMETZA or METAGLIP, you MAY be asked to HOLD this medication 48 hours AFTER the exam.  The purpose of you drinking the oral contrast is to aid in the visualization of your intestinal tract. The contrast solution may cause some diarrhea. Depending on your individual set of symptoms, you may also receive an intravenous injection of x-ray contrast/dye. Plan on being at [facility] for 30 minutes or longer, depending on the type of exam you are having performed.  This test  typically takes 30-45 minutes to complete.  If you have any questions regarding your exam or if you need to reschedule, you may call the CT department at [phone] between the hours of 8:00 am and 5:00 pm, Monday-Friday.  __________________________________________________________________  Thank you for choosing me and [facility].  Pricilla Riffle. Dagoberto Ligas., MD., Marval Regal

## 2018-11-06 NOTE — Progress Notes (Signed)
    History of Present Illness: This is a 68 year old female with chronic LLQ pain.  She relates problems with left lower quadrant pain for several years.  She underwent pelvic ultrasound in 11/2015 which showed a small (1.1 cm) uterine fibroid and was otherwise unremarkable.  At colonoscopy in 10/2017 she had diverticulitis which was treated.  She relates she was treated for diverticulitis and several months after that she had recurrent diverticulitis which was treated by her PCP.  She has alternating diarrhea and constipation, increased intestinal gas, bloating, waxing and waning left lower quadrant pain which has not completely resolved for a few years. Denies weight loss, change in stool caliber, melena, hematochezia, nausea, vomiting, dysphagia, reflux symptoms, chest pain.   Colonoscopy 10/2017  - One 16 mm polyp in the transverse colon, removed with a hot snare. Resected and retrieved. - Four 6 to 8 mm polyps in the sigmoid colon, in the descending colon and in the ascending colon, removed with a cold snare. Resected and retrieved. - Three 4 to 5 mm polyps in the ascending colon and in the cecum, removed with a cold biopsy forceps. Resected and retrieved. biopsy forceps. Resected and retrieved. - Moderate diverticulosis in the left colon. Purulent discharge was seen in association with the diverticular opening, indicative of diverticulitis. - Internal hemorrhoids. - The examination was otherwise normal on direct and retroflexion views. Pathology: TAs and SSP  Current Medications, Allergies, Past Medical History, Past Surgical History, Family History and Social History were reviewed in [employer] record.   Physical Exam: General: Well developed, well nourished, no acute distress Head: Normocephalic and atraumatic Eyes:  sclerae anicteric, EOMI Ears: Normal auditory acuity Mouth: No deformity or lesions Lungs: Clear throughout to auscultation Heart: Regular  rate and rhythm; no murmurs, rubs or bruits Abdomen: Soft, mild LLQ tender and non distended. No masses, hepatosplenomegaly or hernias noted. Normal Bowel sounds Rectal: not done Musculoskeletal: Symmetrical with no gross deformities  Pulses:  Normal pulses noted Extremities: No clubbing, cyanosis, edema or deformities noted Neurological: Alert oriented x 4, grossly nonfocal Psychological:  Alert and cooperative. Normal mood and affect   Assessment and Recommendations:  1. Chronic LLQ pain, aternating bowel pattern. Diverticulosis. R/O diverticulitis, abscess, mass, IBS. Schedule CT AP. If CT AP not diagnostic schedule pelvic US. Low gas diet. Begin glycopyrrolate 2 mg po bid. Gas-X qid prn. Colace qd. REV in 1 month.

## 2018-11-16 ENCOUNTER — Other Ambulatory Visit: Payer: PPO

## 2018-11-22 ENCOUNTER — Other Ambulatory Visit: Payer: Self-pay

## 2018-11-22 ENCOUNTER — Ambulatory Visit (INDEPENDENT_AMBULATORY_CARE_PROVIDER_SITE_OTHER)
Admission: RE | Admit: 2018-11-22 | Discharge: 2018-11-22 | Disposition: A | Discharge status: Home / Self Care | Payer: PPO | Source: Ambulatory Visit | Attending: Gastroenterology | Admitting: Gastroenterology

## 2018-11-22 DIAGNOSIS — R399 Unspecified symptoms and signs involving the genitourinary system: Secondary | ICD-10-CM

## 2018-11-22 DIAGNOSIS — R109 Unspecified abdominal pain: Secondary | ICD-10-CM | POA: Diagnosis not present

## 2018-11-22 DIAGNOSIS — K5730 Diverticulosis of large intestine without perforation or abscess without bleeding: Secondary | ICD-10-CM

## 2018-11-22 DIAGNOSIS — R1032 Left lower quadrant pain: Secondary | ICD-10-CM

## 2018-11-22 MED ORDER — AMOXICILLIN-POT CLAVULANATE 875-125 MG PO TABS: 1.0000 | ORAL_TABLET | Freq: Two times a day (BID) | ORAL | 0 refills | Status: DC

## 2018-11-22 MED ORDER — IOHEXOL 300 MG/ML  SOLN
100.0000 mL | Freq: Once | INTRAMUSCULAR | Status: AC | PRN
  Administered 2018-11-22: 11:00:00 100 mL via INTRAVENOUS

## 2018-11-29 ENCOUNTER — Other Ambulatory Visit: Payer: Self-pay

## 2018-11-29 ENCOUNTER — Ambulatory Visit (HOSPITAL_COMMUNITY)
Admission: RE | Admit: 2018-11-29 | Discharge: 2018-11-29 | Disposition: A | Discharge status: Home / Self Care | Payer: PPO | Source: Ambulatory Visit | Attending: Gastroenterology | Admitting: Gastroenterology

## 2018-11-29 DIAGNOSIS — R399 Unspecified symptoms and signs involving the genitourinary system: Secondary | ICD-10-CM | POA: Insufficient documentation

## 2018-11-29 DIAGNOSIS — N2889 Other specified disorders of kidney and ureter: Secondary | ICD-10-CM | POA: Diagnosis not present

## 2018-11-29 MED ORDER — GADOBUTROL 1 MMOL/ML IV SOLN
10.0000 mL | Freq: Once | INTRAVENOUS | Status: AC | PRN
  Administered 2018-11-29: 10 mL via INTRAVENOUS

## 2018-12-04 DIAGNOSIS — R351 Nocturia: Secondary | ICD-10-CM | POA: Diagnosis not present

## 2018-12-04 DIAGNOSIS — N281 Cyst of kidney, acquired: Secondary | ICD-10-CM | POA: Diagnosis not present

## 2018-12-04 DIAGNOSIS — R3911 Hesitancy of micturition: Secondary | ICD-10-CM | POA: Diagnosis not present

## 2018-12-04 DIAGNOSIS — R3915 Urgency of urination: Secondary | ICD-10-CM | POA: Diagnosis not present

## 2018-12-11 ENCOUNTER — Encounter: Payer: Self-pay | Admitting: Gastroenterology

## 2018-12-11 ENCOUNTER — Ambulatory Visit (INDEPENDENT_AMBULATORY_CARE_PROVIDER_SITE_OTHER): Payer: PPO | Admitting: Gastroenterology

## 2018-12-11 VITALS — BP 122/84 | HR 64 | Temp 98.7°F | Ht 63.0 in | Wt 218.1 lb

## 2018-12-11 DIAGNOSIS — Q453 Other congenital malformations of pancreas and pancreatic duct: Secondary | ICD-10-CM

## 2018-12-11 DIAGNOSIS — K760 Fatty (change of) liver, not elsewhere classified: Secondary | ICD-10-CM

## 2018-12-11 DIAGNOSIS — R1032 Left lower quadrant pain: Secondary | ICD-10-CM

## 2018-12-11 DIAGNOSIS — G8929 Other chronic pain: Secondary | ICD-10-CM

## 2018-12-11 NOTE — Patient Instructions (Signed)
Start IBgard samples taking 1-2 capsules by mouth three times a day before meals.   If you see no improvement in your symptoms after 2 weeks then start robinul (previously prescribed) 1/2 tablet by mouth twice daily.  Thank you for choosing me and Briana Hull.  Briana Hull. Briana Hull., MD., Briana Hull

## 2018-12-11 NOTE — Progress Notes (Signed)
    History of Present Illness: This is a 68 year old female with ongoing left lower quadrant pain that is exacerbated by meals, gas, bowel movements.  She has made several dietary adjustments to regularize her bowel pattern.  She did not begin glycopyrrolate after filling the prescription as she was concerned about exacerbating her dry eyes.  We reviewed the findings her MRI and CT scan in detail.  Current Medications, Allergies, Past Medical History, Past Surgical History, Family History and Social History were reviewed in [employer] record.   Physical Exam: General: Well developed, well nourished, no acute distress Head: Normocephalic and atraumatic Eyes:  sclerae anicteric, EOMI Ears: Normal auditory acuity Mouth: No deformity or lesions Lungs: Clear throughout to auscultation Heart: Regular rate and rhythm; no murmurs, rubs or bruits Abdomen: Soft, left-sided abdominal tenderness to light palpation and non distended. No masses, hepatosplenomegaly or hernias noted. Normal Bowel sounds Rectal: Not done Musculoskeletal: Symmetrical with no gross deformities  Pulses:  Normal pulses noted Extremities: No clubbing, cyanosis, edema or deformities noted Neurological: Alert oriented x 4, grossly nonfocal Psychological:  Alert and cooperative.  Anxious.   Assessment and Recommendations:  1. Suspected IBS with LLQ pain and alternating bowel habits.  Left-sided abdominal tenderness to light palpation.  Possible component of musculoskeletal abdominal wall pain as well. After discussion of options she prefers IBgard 1-2 p.o. 3 times daily AC.  If this is not effective then glycopyrrolate 1 mg p.o. twice daily.  Avoid foods that trigger symptoms.  REV in 6-8 weeks.   2.  History of diverticulitis.  3.  Mild hepatomegaly and mild splenomegaly, measuring 19 cm and 13.9 cm respectively on MRI.    4.  Pancreas divisum.  5. Hepatic steatosis.  Long-term weight loss program  with a fat modified carb modified diet supervised by her PCP.   I spent 25 minutes of face-to-face time with the patient. Greater than 50% of the time was spent counseling and coordinating care.

## 2019-03-27 NOTE — Progress Notes (Signed)
Briana Hull presents for follow up of radiation completed 08/20/18 to her nose. She reports several areas to her nose "that look strange". She is applying vitamin E to her radiation site twice daily. She is feeling well today.   BP (!) 152/80 (BP Location: Left Arm, Patient Position: Sitting)   Pulse 71   Temp 98 F (36.7 C) (Temporal)   Resp 18   Ht 5\' 3"  (1.6 m)   Wt 214 lb (97.1 kg)   SpO2 98%   BMI 37.91 kg/m    Wt Readings from Last 3 Encounters:  03/29/19 214 lb (97.1 kg)  12/11/18 218 lb 2 oz (98.9 kg)  11/06/18 217 lb (98.4 kg)

## 2019-03-28 NOTE — Progress Notes (Signed)
Radiation Oncology         [phone] ________________________________  Name: Briana Hull MRN: [id number]  Date: 03/29/2019  DOB: 1950/09/04  Follow-Up Visit Note in person  outpatient  CC: Tisovec, Fransico Him, MD  Griselda Miner, MD  Diagnosis and Prior Radiotherapy:    ICD-10-CM   1. Basal cell carcinoma of nose  C44.311     CHIEF COMPLAINT: Here for follow-up and surveillance of skin cancer  Narrative:  Briana Hull presents for follow up of radiation completed 08/20/18 to her nose.  Briana Hull presents for follow up of radiation completed 08/20/18 to her nose. She reports several areas to her nose "that look strange" and she feels that they were not present at her last dermatology appointment. She is applying vitamin E to her radiation site twice daily. She is feeling well today.  She is scheduled to receive her Covid vaccine this afternoon.    Wt Readings from Last 3 Encounters:  03/29/19 214 lb (97.1 kg)  12/11/18 218 lb 2 oz (98.9 kg)  11/06/18 217 lb (98.4 kg)                                ALLERGIES:  is allergic to other; codeine; and hydrocodone.  Meds: Current Outpatient Medications  Medication Sig Dispense Refill  . albuterol (PROVENTIL HFA;VENTOLIN HFA) 108 (90 BASE) MCG/ACT inhaler Inhale 1-2 puffs into the lungs every 6 (six) hours as needed for wheezing or shortness of breath. 1 Inhaler 2  . atenolol (TENORMIN) 25 MG tablet TAKE 1 TABLET (25 MG TOTAL) BY MOUTH DAILY. 90 tablet 2  . hydroxypropyl methylcellulose / hypromellose (ISOPTO TEARS / GONIOVISC) 2.5 % ophthalmic solution 1 drop.    Marland Kitchen Peppermint Oil (IBGARD PO) Take 1 capsule by mouth. She will take from 0-6 tablets daily. She usually takes 3-4 daily.    . valACYclovir (VALTREX) 500 MG tablet TAKE 1 TABLET BY MOUTH TWICE A DAY 30 tablet 2  . fluticasone (FLONASE) 50 MCG/ACT nasal spray Place 2 sprays into both nostrils daily as needed for allergies or rhinitis. (Patient not taking: Reported on  03/29/2019) 16 g 5  . glycopyrrolate (ROBINUL) 2 MG tablet Take 1 tablet (2 mg total) by mouth 2 (two) times daily. (Patient not taking: Reported on 12/11/2018) 180 tablet 3   No current facility-administered medications for this encounter.    Physical Findings: The patient is in no acute distress. Patient is alert and oriented.  height is 5\' 3"  (1.6 m) and weight is 214 lb (97.1 kg). Her temporal temperature is 98 F (36.7 C). Her blood pressure is 152/80 (abnormal) and her pulse is 71. Her respiration is 18 and oxygen saturation is 98%. .     Her nose demonstrates no obvious evidence of disease.  The erythema of the skin has largely resolved.  There are a couple nonspecific punctate bumps superficial on the skin of the nose.  Lab Findings: Lab Results  Component Value Date   WBC 6.1 12/22/2015   HGB 14.3 12/22/2015   HCT 41.6 12/22/2015   MCV 93.7 12/22/2015   PLT 262.0 12/22/2015    Radiographic Findings: No results found.  Impression/Plan: In my assessment she has no evidence of disease over the nose at the site of radiation therapy.  She is concerned that the pigment of her nose has changed over the past few weeks.  She also has some nonspecific punctate bumps on  her nose.  These are causing concern for the patient and given that her prior cancer presented in an atypical fashion, I suggested she contact her dermatologist for another assessment out of caution -I am not sure biopsy will be warranted, but she would like to explore this just in case.  She states that it is hard for her to schedule follow-up with her dermatologist so our nurse, Anderson Malta, is going to try to help her.  Otherwise, I will see her back for follow-up in 6 months.  She is pleased with this plan.  On date of service, in total, I spent 25 minutes on this encounter _____________________________________   Eppie Gibson, MD  This document serves as a record of services personally performed by Eppie Gibson, MD.  It was created on her behalf by Wilburn Mylar, a trained medical scribe. The creation of this record is based on the scribe's personal observations and the provider's statements to them. This document has been checked and approved by the attending provider.

## 2019-03-29 ENCOUNTER — Ambulatory Visit
Admission: RE | Admit: 2019-03-29 | Discharge: 2019-03-29 | Disposition: A | Discharge status: Home / Self Care | Payer: PPO | Source: Ambulatory Visit | Attending: Radiation Oncology | Admitting: Radiation Oncology

## 2019-03-29 ENCOUNTER — Encounter: Payer: Self-pay | Admitting: Radiation Oncology

## 2019-03-29 ENCOUNTER — Other Ambulatory Visit: Payer: Self-pay

## 2019-03-29 ENCOUNTER — Ambulatory Visit: Payer: PPO | Attending: Internal Medicine

## 2019-03-29 DIAGNOSIS — Z85828 Personal history of other malignant neoplasm of skin: Secondary | ICD-10-CM | POA: Diagnosis not present

## 2019-03-29 DIAGNOSIS — Z79899 Other long term (current) drug therapy: Secondary | ICD-10-CM | POA: Insufficient documentation

## 2019-03-29 DIAGNOSIS — Z23 Encounter for immunization: Secondary | ICD-10-CM

## 2019-03-29 DIAGNOSIS — Z923 Personal history of irradiation: Secondary | ICD-10-CM | POA: Diagnosis not present

## 2019-03-29 DIAGNOSIS — C44311 Basal cell carcinoma of skin of nose: Secondary | ICD-10-CM

## 2019-03-29 DIAGNOSIS — Z08 Encounter for follow-up examination after completed treatment for malignant neoplasm: Secondary | ICD-10-CM | POA: Diagnosis not present

## 2019-03-29 HISTORY — DX: Diverticulosis of intestine, part unspecified, without perforation or abscess without bleeding: K57.90

## 2019-03-29 NOTE — Progress Notes (Signed)
   Covid-19 Vaccination Clinic  Name:  Briana Hull    MRN: [id number] DOB: [birth year]  03/29/2019  [name] was observed post Covid-19 immunization for 15 minutes without incidence. She was provided with Vaccine Information Sheet and instruction to access the V-Safe system.   [name] was instructed to call 911 with any severe reactions post vaccine: Marland Kitchen Difficulty breathing  . Swelling of your face and throat  . A fast heartbeat  . A bad rash all over your body  . Dizziness and weakness    Immunizations Administered    Name Date Dose VIS Date Route   Pfizer COVID-19 Vaccine 03/29/2019  3:55 PM 0.3 mL 01/11/2019 Intramuscular   Manufacturer: [place]   Lot: [id number]   [place]: [id number]

## 2019-04-02 ENCOUNTER — Telehealth: Payer: Self-pay | Admitting: *Deleted

## 2019-04-02 ENCOUNTER — Encounter: Payer: Self-pay | Admitting: Radiation Oncology

## 2019-04-02 NOTE — Telephone Encounter (Signed)
CALLED PATIENT TO INFORM OF FU APPT. WITH DR. Isidore Moos ON 10-04-19 @ 2 PM, SPOKE WITH PATIENT AND SHE IS AWARE OF THIS APPT.

## 2019-04-08 DIAGNOSIS — B078 Other viral warts: Secondary | ICD-10-CM | POA: Diagnosis not present

## 2019-04-08 DIAGNOSIS — L720 Epidermal cyst: Secondary | ICD-10-CM | POA: Diagnosis not present

## 2019-04-08 DIAGNOSIS — Z85828 Personal history of other malignant neoplasm of skin: Secondary | ICD-10-CM | POA: Diagnosis not present

## 2019-04-08 DIAGNOSIS — L821 Other seborrheic keratosis: Secondary | ICD-10-CM | POA: Diagnosis not present

## 2019-04-08 DIAGNOSIS — L814 Other melanin hyperpigmentation: Secondary | ICD-10-CM | POA: Diagnosis not present

## 2019-04-24 ENCOUNTER — Ambulatory Visit: Payer: PPO | Attending: Internal Medicine

## 2019-04-24 DIAGNOSIS — Z23 Encounter for immunization: Secondary | ICD-10-CM

## 2019-04-24 NOTE — Progress Notes (Signed)
   Covid-19 Vaccination Clinic  Name:  Briana Hull    MRN: [id number] DOB: [birth year]  04/24/2019  Briana Hull was observed post Covid-19 immunization for 15 minutes without incident. She was provided with Vaccine Information Sheet and instruction to access the V-Safe system.   Briana Hull was instructed to call 911 with any severe reactions post vaccine: Marland Kitchen Difficulty breathing  . Swelling of face and throat  . A fast heartbeat  . A bad rash all over body  . Dizziness and weakness   Immunizations Administered    Name Date Dose VIS Date Route   Pfizer COVID-19 Vaccine 04/24/2019  4:01 PM 0.3 mL 01/11/2019 Intramuscular   Manufacturer: [place]   Lot: [id number]   [place]: [id number]

## 2019-08-05 DIAGNOSIS — Z20822 Contact with and (suspected) exposure to covid-19: Secondary | ICD-10-CM | POA: Diagnosis not present

## 2019-08-26 DIAGNOSIS — Z7689 Persons encountering health services in other specified circumstances: Secondary | ICD-10-CM | POA: Diagnosis not present

## 2019-08-26 DIAGNOSIS — M19072 Primary osteoarthritis, left ankle and foot: Secondary | ICD-10-CM | POA: Diagnosis not present

## 2019-08-26 DIAGNOSIS — I10 Essential (primary) hypertension: Secondary | ICD-10-CM | POA: Diagnosis not present

## 2019-08-26 DIAGNOSIS — H6593 Unspecified nonsuppurative otitis media, bilateral: Secondary | ICD-10-CM | POA: Diagnosis not present

## 2019-08-26 DIAGNOSIS — H9193 Unspecified hearing loss, bilateral: Secondary | ICD-10-CM | POA: Diagnosis not present

## 2019-08-26 DIAGNOSIS — R202 Paresthesia of skin: Secondary | ICD-10-CM | POA: Diagnosis not present

## 2019-08-26 DIAGNOSIS — M2550 Pain in unspecified joint: Secondary | ICD-10-CM | POA: Diagnosis not present

## 2019-09-10 DIAGNOSIS — M25552 Pain in left hip: Secondary | ICD-10-CM | POA: Diagnosis not present

## 2019-09-10 DIAGNOSIS — M545 Low back pain: Secondary | ICD-10-CM | POA: Diagnosis not present

## 2019-09-25 DIAGNOSIS — M5416 Radiculopathy, lumbar region: Secondary | ICD-10-CM | POA: Diagnosis not present

## 2019-10-02 DIAGNOSIS — M25552 Pain in left hip: Secondary | ICD-10-CM | POA: Diagnosis not present

## 2019-10-04 ENCOUNTER — Ambulatory Visit: Payer: PPO | Admitting: Radiation Oncology

## 2019-10-08 DIAGNOSIS — M545 Low back pain: Secondary | ICD-10-CM | POA: Diagnosis not present

## 2019-10-08 DIAGNOSIS — M5136 Other intervertebral disc degeneration, lumbar region: Secondary | ICD-10-CM | POA: Diagnosis not present

## 2019-10-08 DIAGNOSIS — M4180 Other forms of scoliosis, site unspecified: Secondary | ICD-10-CM | POA: Diagnosis not present

## 2019-10-08 DIAGNOSIS — M48062 Spinal stenosis, lumbar region with neurogenic claudication: Secondary | ICD-10-CM | POA: Diagnosis not present

## 2019-10-09 DIAGNOSIS — R7301 Impaired fasting glucose: Secondary | ICD-10-CM | POA: Diagnosis not present

## 2019-10-09 DIAGNOSIS — I10 Essential (primary) hypertension: Secondary | ICD-10-CM | POA: Diagnosis not present

## 2019-10-14 DIAGNOSIS — J4530 Mild persistent asthma, uncomplicated: Secondary | ICD-10-CM | POA: Diagnosis not present

## 2019-10-14 DIAGNOSIS — D692 Other nonthrombocytopenic purpura: Secondary | ICD-10-CM | POA: Diagnosis not present

## 2019-10-14 DIAGNOSIS — J302 Other seasonal allergic rhinitis: Secondary | ICD-10-CM | POA: Diagnosis not present

## 2019-10-14 DIAGNOSIS — R7301 Impaired fasting glucose: Secondary | ICD-10-CM | POA: Diagnosis not present

## 2019-10-14 DIAGNOSIS — G43909 Migraine, unspecified, not intractable, without status migrainosus: Secondary | ICD-10-CM | POA: Diagnosis not present

## 2019-10-14 DIAGNOSIS — I10 Essential (primary) hypertension: Secondary | ICD-10-CM | POA: Diagnosis not present

## 2019-10-14 DIAGNOSIS — M19072 Primary osteoarthritis, left ankle and foot: Secondary | ICD-10-CM | POA: Diagnosis not present

## 2019-10-14 DIAGNOSIS — Z6838 Body mass index (BMI) 38.0-38.9, adult: Secondary | ICD-10-CM | POA: Diagnosis not present

## 2019-10-14 DIAGNOSIS — B009 Herpesviral infection, unspecified: Secondary | ICD-10-CM | POA: Diagnosis not present

## 2019-10-14 DIAGNOSIS — Z1212 Encounter for screening for malignant neoplasm of rectum: Secondary | ICD-10-CM | POA: Diagnosis not present

## 2019-10-14 DIAGNOSIS — K219 Gastro-esophageal reflux disease without esophagitis: Secondary | ICD-10-CM | POA: Diagnosis not present

## 2019-10-14 DIAGNOSIS — Z0000 Encounter for general adult medical examination without abnormal findings: Secondary | ICD-10-CM | POA: Diagnosis not present

## 2019-10-29 DIAGNOSIS — M48062 Spinal stenosis, lumbar region with neurogenic claudication: Secondary | ICD-10-CM | POA: Diagnosis not present

## 2019-10-29 DIAGNOSIS — M5136 Other intervertebral disc degeneration, lumbar region: Secondary | ICD-10-CM | POA: Diagnosis not present

## 2019-11-11 DIAGNOSIS — M25572 Pain in left ankle and joints of left foot: Secondary | ICD-10-CM | POA: Diagnosis not present

## 2019-11-11 DIAGNOSIS — M19072 Primary osteoarthritis, left ankle and foot: Secondary | ICD-10-CM | POA: Diagnosis not present

## 2019-11-18 DIAGNOSIS — M48062 Spinal stenosis, lumbar region with neurogenic claudication: Secondary | ICD-10-CM | POA: Diagnosis not present

## 2019-11-29 DIAGNOSIS — M48062 Spinal stenosis, lumbar region with neurogenic claudication: Secondary | ICD-10-CM | POA: Diagnosis not present

## 2019-12-04 DIAGNOSIS — M48062 Spinal stenosis, lumbar region with neurogenic claudication: Secondary | ICD-10-CM | POA: Diagnosis not present

## 2020-01-09 DIAGNOSIS — D485 Neoplasm of uncertain behavior of skin: Secondary | ICD-10-CM | POA: Diagnosis not present

## 2020-01-09 DIAGNOSIS — B078 Other viral warts: Secondary | ICD-10-CM | POA: Diagnosis not present

## 2020-01-09 DIAGNOSIS — L821 Other seborrheic keratosis: Secondary | ICD-10-CM | POA: Diagnosis not present

## 2020-01-09 DIAGNOSIS — L573 Poikiloderma of Civatte: Secondary | ICD-10-CM | POA: Diagnosis not present

## 2020-01-09 DIAGNOSIS — Z85828 Personal history of other malignant neoplasm of skin: Secondary | ICD-10-CM | POA: Diagnosis not present

## 2020-01-09 DIAGNOSIS — L570 Actinic keratosis: Secondary | ICD-10-CM | POA: Diagnosis not present

## 2020-01-09 DIAGNOSIS — L720 Epidermal cyst: Secondary | ICD-10-CM | POA: Diagnosis not present

## 2020-09-23 DIAGNOSIS — M5136 Other intervertebral disc degeneration, lumbar region: Secondary | ICD-10-CM | POA: Diagnosis not present

## 2020-10-16 DIAGNOSIS — I10 Essential (primary) hypertension: Secondary | ICD-10-CM | POA: Diagnosis not present

## 2020-10-23 DIAGNOSIS — Z1212 Encounter for screening for malignant neoplasm of rectum: Secondary | ICD-10-CM | POA: Diagnosis not present

## 2020-10-23 DIAGNOSIS — Z0000 Encounter for general adult medical examination without abnormal findings: Secondary | ICD-10-CM | POA: Diagnosis not present

## 2020-10-23 DIAGNOSIS — K219 Gastro-esophageal reflux disease without esophagitis: Secondary | ICD-10-CM | POA: Diagnosis not present

## 2020-10-23 DIAGNOSIS — R7301 Impaired fasting glucose: Secondary | ICD-10-CM | POA: Diagnosis not present

## 2020-10-23 DIAGNOSIS — J4530 Mild persistent asthma, uncomplicated: Secondary | ICD-10-CM | POA: Diagnosis not present

## 2020-10-23 DIAGNOSIS — B009 Herpesviral infection, unspecified: Secondary | ICD-10-CM | POA: Diagnosis not present

## 2020-10-23 DIAGNOSIS — H9313 Tinnitus, bilateral: Secondary | ICD-10-CM | POA: Diagnosis not present

## 2020-10-23 DIAGNOSIS — G43909 Migraine, unspecified, not intractable, without status migrainosus: Secondary | ICD-10-CM | POA: Diagnosis not present

## 2020-10-23 DIAGNOSIS — M19072 Primary osteoarthritis, left ankle and foot: Secondary | ICD-10-CM | POA: Diagnosis not present

## 2020-10-23 DIAGNOSIS — I10 Essential (primary) hypertension: Secondary | ICD-10-CM | POA: Diagnosis not present

## 2020-10-23 DIAGNOSIS — Z23 Encounter for immunization: Secondary | ICD-10-CM | POA: Diagnosis not present

## 2020-10-23 DIAGNOSIS — D692 Other nonthrombocytopenic purpura: Secondary | ICD-10-CM | POA: Diagnosis not present

## 2020-10-23 DIAGNOSIS — R82998 Other abnormal findings in urine: Secondary | ICD-10-CM | POA: Diagnosis not present

## 2020-11-11 DIAGNOSIS — Z77122 Contact with and (suspected) exposure to noise: Secondary | ICD-10-CM | POA: Diagnosis not present

## 2020-11-11 DIAGNOSIS — H903 Sensorineural hearing loss, bilateral: Secondary | ICD-10-CM | POA: Diagnosis not present

## 2020-11-11 DIAGNOSIS — Z822 Family history of deafness and hearing loss: Secondary | ICD-10-CM | POA: Diagnosis not present

## 2020-11-11 DIAGNOSIS — H9313 Tinnitus, bilateral: Secondary | ICD-10-CM | POA: Diagnosis not present

## 2020-11-17 DIAGNOSIS — L820 Inflamed seborrheic keratosis: Secondary | ICD-10-CM | POA: Diagnosis not present

## 2020-11-17 DIAGNOSIS — Z85828 Personal history of other malignant neoplasm of skin: Secondary | ICD-10-CM | POA: Diagnosis not present

## 2020-11-17 DIAGNOSIS — L821 Other seborrheic keratosis: Secondary | ICD-10-CM | POA: Diagnosis not present

## 2020-11-17 DIAGNOSIS — I788 Other diseases of capillaries: Secondary | ICD-10-CM | POA: Diagnosis not present

## 2020-11-30 ENCOUNTER — Other Ambulatory Visit: Payer: Self-pay | Admitting: Internal Medicine

## 2020-11-30 DIAGNOSIS — Z1231 Encounter for screening mammogram for malignant neoplasm of breast: Secondary | ICD-10-CM

## 2020-12-02 DIAGNOSIS — I10 Essential (primary) hypertension: Secondary | ICD-10-CM | POA: Diagnosis not present

## 2020-12-31 ENCOUNTER — Ambulatory Visit
Admission: RE | Admit: 2020-12-31 | Discharge: 2020-12-31 | Disposition: A | Discharge status: Home / Self Care | Payer: PPO | Source: Ambulatory Visit

## 2020-12-31 DIAGNOSIS — Z1231 Encounter for screening mammogram for malignant neoplasm of breast: Secondary | ICD-10-CM

## 2021-01-13 DIAGNOSIS — B360 Pityriasis versicolor: Secondary | ICD-10-CM | POA: Diagnosis not present

## 2021-01-13 DIAGNOSIS — L821 Other seborrheic keratosis: Secondary | ICD-10-CM | POA: Diagnosis not present

## 2021-01-13 DIAGNOSIS — L853 Xerosis cutis: Secondary | ICD-10-CM | POA: Diagnosis not present

## 2021-01-13 DIAGNOSIS — L814 Other melanin hyperpigmentation: Secondary | ICD-10-CM | POA: Diagnosis not present

## 2021-01-13 DIAGNOSIS — D1801 Hemangioma of skin and subcutaneous tissue: Secondary | ICD-10-CM | POA: Diagnosis not present

## 2021-01-13 DIAGNOSIS — D2371 Other benign neoplasm of skin of right lower limb, including hip: Secondary | ICD-10-CM | POA: Diagnosis not present

## 2021-01-13 DIAGNOSIS — L570 Actinic keratosis: Secondary | ICD-10-CM | POA: Diagnosis not present

## 2021-01-13 DIAGNOSIS — D2372 Other benign neoplasm of skin of left lower limb, including hip: Secondary | ICD-10-CM | POA: Diagnosis not present

## 2021-01-13 DIAGNOSIS — Z85828 Personal history of other malignant neoplasm of skin: Secondary | ICD-10-CM | POA: Diagnosis not present

## 2021-02-03 DIAGNOSIS — R300 Dysuria: Secondary | ICD-10-CM | POA: Diagnosis not present

## 2021-03-05 ENCOUNTER — Other Ambulatory Visit: Payer: Self-pay

## 2021-03-05 ENCOUNTER — Telehealth: Payer: Self-pay | Admitting: *Deleted

## 2021-03-05 ENCOUNTER — Ambulatory Visit (AMBULATORY_SURGERY_CENTER): Payer: Self-pay | Admitting: *Deleted

## 2021-03-05 VITALS — Ht 63.0 in | Wt 215.0 lb

## 2021-03-05 DIAGNOSIS — Z86010 Personal history of colonic polyps: Secondary | ICD-10-CM

## 2021-03-05 MED ORDER — PLENVU 140 G PO SOLR: 1.0000 | ORAL | 0 refills | Status: DC

## 2021-03-05 NOTE — Telephone Encounter (Signed)
DR Fuller Plan, I did a PV on this lady - she said during her PV  She  having alternating diarrhea and having hard stools- uses stool softener and fiber caps daily - -  change in stools of alternating as above- stomach pains, bloating, increased gas and belching- cramps with eating and with bm's, decreased in appetite - no vomiting , no rectal bleeding - she has a hx of colon polyps- last colon 2019 8 polyps, TA+,  SSP +- colon prior had 16 polyps- her colon is 2-16, but I wanted to make you aware of her concerns above.  Please advise-  thanks for your time, Lelan Pons

## 2021-03-05 NOTE — Progress Notes (Signed)
No egg or soy allergy known to patient  No issues known to pt with past sedation with any surgeries or procedures- pt states she has woken up with surgeries in the past  Patient denies ever being told they had issues or difficulty with intubation  No FH of Malignant Hyperthermia Pt is not on diet pills Pt is not on  home 02  Pt is not on blood thinners  Pt denies issues with constipation  No A fib or A flutter  Pt is fully vaccinated  for Covid   Pt having alternating diarrhea and having hard stools- uses stool softener and fiber caps daily - still with this has hard stools occ -  change in stools of alternating as above- stomach pains, bloating, increased gas and belching- cramps with eating and with bm's, decreased in appetite - no vomiting , no rectal bleeding   Due to the COVID-19 pandemic we are asking patients to follow certain guidelines in PV and the [place]   Pt aware of COVID protocols and [facility] guidelines   PV completed over the phone. Pt verified name, DOB, address and insurance during PV today.  Pt mailed instruction packet with copy of consent form to read and not return, and instructions.  Pt encouraged to call with questions or issues.  If pt has My chart, procedure instructions sent via My Chart

## 2021-03-09 NOTE — Telephone Encounter (Signed)
Spoke with pt and advised the need for OV- pt wishes to proceed with colon, and after colon make ov once she gets her colon results

## 2021-03-09 NOTE — Telephone Encounter (Signed)
She should schedule an office visit to evaluate her symptoms which can be done before or after her colonoscopy.

## 2021-03-17 ENCOUNTER — Encounter: Payer: Self-pay | Admitting: Gastroenterology

## 2021-03-18 ENCOUNTER — Encounter: Payer: Self-pay | Admitting: Gastroenterology

## 2021-03-18 ENCOUNTER — Ambulatory Visit (AMBULATORY_SURGERY_CENTER): Payer: PPO | Admitting: Gastroenterology

## 2021-03-18 ENCOUNTER — Other Ambulatory Visit: Payer: Self-pay

## 2021-03-18 VITALS — BP 114/90 | HR 74 | Temp 97.9°F | Resp 12 | Ht 63.0 in | Wt 215.0 lb

## 2021-03-18 DIAGNOSIS — D127 Benign neoplasm of rectosigmoid junction: Secondary | ICD-10-CM | POA: Diagnosis not present

## 2021-03-18 DIAGNOSIS — Z86010 Personal history of colonic polyps: Secondary | ICD-10-CM

## 2021-03-18 DIAGNOSIS — D124 Benign neoplasm of descending colon: Secondary | ICD-10-CM | POA: Diagnosis not present

## 2021-03-18 DIAGNOSIS — D128 Benign neoplasm of rectum: Secondary | ICD-10-CM

## 2021-03-18 DIAGNOSIS — D125 Benign neoplasm of sigmoid colon: Secondary | ICD-10-CM | POA: Diagnosis not present

## 2021-03-18 DIAGNOSIS — D123 Benign neoplasm of transverse colon: Secondary | ICD-10-CM

## 2021-03-18 MED ORDER — SODIUM CHLORIDE 0.9 % IV SOLN: 500.0000 mL | Freq: Once | INTRAVENOUS | Status: DC

## 2021-03-18 NOTE — Progress Notes (Signed)
History & Physical  Primary Care Physician:  Tisovec, Fransico Him, MD Primary Gastroenterologist: Lucio Edward, MD  CHIEF COMPLAINT:  Personal history of colon polyps   HPI: Briana Hull is a 71 y.o. female with a personal history of multiple tubular adenomas and sessile serrated polyps for surveillance colonoscopy.  Last colonoscopy 2019.   Past Medical History:  Diagnosis Date   Allergic asthma    Allergy    seasonal/multiple environmental allergies present   Basal cell carcinoma    nose   Cataract    removed both eyes   Degenerative joint disease    in knees, hips,ankles   Diverticulosis    Duodenal ulcer    Fatty liver    per pt   Herpes simplex of female genitalia    History of chicken pox    History of radiation therapy 07/09/18- 08/20/18   Nose, 30 fx of 2 Gy for a total of 60 Gy   Hypertension    Hypoglycemia    Migraine    Seasonal allergies    Tubular adenoma of colon 2016    Past Surgical History:  Procedure Laterality Date   CATARACT EXTRACTION W/ INTRAOCULAR LENS  IMPLANT, BILATERAL  2007/2018   [place]   COLONOSCOPY     COSMETIC SURGERY     Patient reports plastic surgery to neck, eyes, nose in the past.    LIPOSUCTION  2001   POLYPECTOMY     [place]      Prior to Admission medications   Medication Sig Start Date End Date Taking? Authorizing Provider  atenolol (TENORMIN) 25 MG tablet TAKE 1 TABLET (25 MG TOTAL) BY MOUTH DAILY. 05/13/16  Yes Venia Carbon, MD  Biotin 1000 MCG tablet Take 1,000 mcg by mouth 3 (three) times daily.   Yes [provider]  Cholecalciferol (VITAMIN D3) 50 MCG (2000 UT) TABS 1 tablet   Yes [provider]  cyclobenzaprine (FLEXERIL) 10 MG tablet Take 10 mg by mouth at bedtime. 02/10/21  Yes [provider]  Docusate Sodium (EQ STOOL SOFTENER PO) Take by mouth.   Yes [provider]  hydroxypropyl methylcellulose / hypromellose (ISOPTO  TEARS / GONIOVISC) 2.5 % ophthalmic solution 1 drop.   Yes [provider]  Inulin (FIBER CHOICE PO) Take by mouth.   Yes [provider]  Magnesium 500 MG TABS one po every day 08/26/19  Yes [provider]  valACYclovir (VALTREX) 500 MG tablet TAKE 1 TABLET BY MOUTH TWICE A DAY 07/08/16  Yes Venia Carbon, MD  albuterol (PROVENTIL HFA;VENTOLIN HFA) 108 (90 BASE) MCG/ACT inhaler Inhale 1-2 puffs into the lungs every 6 (six) hours as needed for wheezing or shortness of breath. Patient not taking: Reported on 03/05/2021 12/05/14   Brunetta Jeans, PA-C  Cyanocobalamin (B-12) 100 MCG TABS Take 1 tablet by mouth daily. 09/29/16   [provider]  fluticasone (FLONASE) 50 MCG/ACT nasal spray Place 2 sprays into both nostrils daily as needed for allergies or rhinitis. Patient not taking: Reported on 03/29/2019 12/03/14   Brunetta Jeans, PA-C  Peppermint Oil (IBGARD PO) Take 1 capsule by mouth as needed. She will take from 0-6 tablets daily. She usually takes 3-4 daily.    [provider]  TRAMADOL HCL PO     [provider]    Current Outpatient Medications  Medication Sig Dispense Refill   atenolol (TENORMIN) 25 MG tablet  TAKE 1 TABLET (25 MG TOTAL) BY MOUTH DAILY. 90 tablet 2   Biotin 1000 MCG tablet Take 1,000 mcg by mouth 3 (three) times daily.     Cholecalciferol (VITAMIN D3) 50 MCG (2000 UT) TABS 1 tablet     cyclobenzaprine (FLEXERIL) 10 MG tablet Take 10 mg by mouth at bedtime.     Docusate Sodium (EQ STOOL SOFTENER PO) Take by mouth.     hydroxypropyl methylcellulose / hypromellose (ISOPTO TEARS / GONIOVISC) 2.5 % ophthalmic solution 1 drop.     Inulin (FIBER CHOICE PO) Take by mouth.     Magnesium 500 MG TABS one po every day     valACYclovir (VALTREX) 500 MG tablet TAKE 1 TABLET BY MOUTH TWICE A DAY 30 tablet 2   albuterol (PROVENTIL HFA;VENTOLIN HFA) 108 (90 BASE) MCG/ACT inhaler Inhale 1-2 puffs into the lungs every 6 (six) hours  as needed for wheezing or shortness of breath. (Patient not taking: Reported on 03/05/2021) 1 Inhaler 2   Cyanocobalamin (B-12) 100 MCG TABS Take 1 tablet by mouth daily.     fluticasone (FLONASE) 50 MCG/ACT nasal spray Place 2 sprays into both nostrils daily as needed for allergies or rhinitis. (Patient not taking: Reported on 03/29/2019) 16 g 5   Peppermint Oil (IBGARD PO) Take 1 capsule by mouth as needed. She will take from 0-6 tablets daily. She usually takes 3-4 daily.     TRAMADOL HCL PO  (Patient not taking: Reported on 03/05/2021)     Current Facility-Administered Medications  Medication Dose Route Frequency Provider Last Rate Last Admin   0.9 %  sodium chloride infusion  500 mL Intravenous Once Ladene Artist, MD        Allergies as of 03/18/2021 - Review Complete 03/18/2021  Allergen Reaction Noted   Other Diarrhea and Nausea And Vomiting 11/20/2013   [place]  10/19/2020   Codeine Nausea And Vomiting 11/20/2013   Hydrocodone Other (See Comments) 05/30/2016    Family History  Problem Relation Age of Onset   Breast cancer Mother 44       Deceased   Stroke Mother    Hypertension Mother    Cancer Mother        Breast cancer   Renal cancer Father    Lung cancer Father 1       Deceased   Melanoma Father    Cancer Father    Ovarian cancer Sister    Diabetes Brother    Brain cancer Brother        G-Blastoma   Lung cancer Brother    Cancer - Other Maternal Aunt        Pancreatic   Heart disease Maternal Aunt    Heart attack Maternal Aunt    Lung cancer Maternal Aunt    Melanoma Paternal Aunt    Multiple myeloma Paternal Aunt    Heart disease Maternal Grandmother    Stomach cancer Paternal Grandfather    Rectal cancer Cousin    Colon cancer Neg Hx    Esophageal cancer Neg Hx    Colon polyps Neg Hx     Social History   Socioeconomic History   Marital status: Divorced    Spouse name: Not on file   Number of children: 0   Years of education: College   Highest  education level: Not on file  Occupational History   Occupation: [employer]    Comment: Counselor/Sales  Tobacco Use   Smoking status: Former    Types:  Cigarettes    Quit date: 09/07/1981    Years since quitting: 39.5   Smokeless tobacco: Never   Tobacco comments:    Quit >30 yrs  Vaping Use   Vaping Use: Never used  Substance and Sexual Activity   Alcohol use: Yes    Comment: 0-2 drinks daily   Drug use: No   Sexual activity: Not Currently  Other Topics Concern   Not on file  Social History Narrative   Lives at home w/ her sister/BIL   Social Determinants of Health   Financial Resource Strain: Not on file  Food Insecurity: Not on file  Transportation Needs: Not on file  Physical Activity: Not on file  Stress: Not on file  Social Connections: Not on file  Intimate Partner Violence: Not on file    Review of Systems:  All systems reviewed an negative except where noted in HPI.  Gen: Denies any fever, chills, sweats, anorexia, fatigue, weakness, malaise, weight loss, and sleep disorder CV: Denies chest pain, angina, palpitations, syncope, orthopnea, PND, peripheral edema, and claudication. Resp: Denies dyspnea at rest, dyspnea with exercise, cough, sputum, wheezing, coughing up blood, and pleurisy. GI: Denies vomiting blood, jaundice, and fecal incontinence.   Denies dysphagia or odynophagia. GU : Denies urinary burning, blood in urine, urinary frequency, urinary hesitancy, nocturnal urination, and urinary incontinence. MS: Denies joint pain, limitation of movement, and swelling, stiffness, low back pain, extremity pain. Denies muscle weakness, cramps, atrophy.  Derm: Denies rash, itching, dry skin, hives, moles, warts, or unhealing ulcers.  Psych: Denies depression, anxiety, memory loss, suicidal ideation, hallucinations, paranoia, and confusion. Heme: Denies bruising, bleeding, and enlarged lymph nodes. Neuro:  Denies any headaches, dizziness, paresthesias. Endo:   Denies any problems with DM, thyroid, adrenal function.   Physical Exam: General:  Alert, well-developed, in NAD Head:  Normocephalic and atraumatic. Eyes:  Sclera clear, no icterus.   Conjunctiva pink. Ears:  Normal auditory acuity. Mouth:  No deformity or lesions.  Neck:  Supple; no masses . Lungs:  Clear throughout to auscultation.   No wheezes, crackles, or rhonchi. No acute distress. Heart:  Regular rate and rhythm; no murmurs. Abdomen:  Soft, nondistended, nontender. No masses, hepatomegaly. No obvious masses.  Normal bowel .    Rectal:  Deferred   Msk:  Symmetrical without gross deformities.. Pulses:  Normal pulses noted. Extremities:  Without edema. Neurologic:  Alert and  oriented x4;  grossly normal neurologically. Skin:  Intact without significant lesions or rashes. Cervical Nodes:  No significant cervical adenopathy. Psych:  Alert and cooperative. Normal mood and affect.   Impression / Plan:   Personal history of multiple tubular adenomas and sessile serrated polyps for surveillance colonoscopy.  Last colonoscopy 2019.  Pricilla Riffle. Fuller Plan  03/18/2021, 8:02 AM See Shea Evans, [facility], to contact our on call provider

## 2021-03-18 NOTE — Op Note (Signed)
[facility] Patient Name: Briana Hull Procedure Date: 03/18/2021 7:35 AM MRN: [id number] Endoscopist: Ladene Artist , MD Age: 71 Referring MD:  Date of Birth: 08-30-1950 Gender: Female Account #: [id number] Procedure:                Colonoscopy Indications:              Surveillance: Personal history of adenomatous                            polyps on last colonoscopy 3 years ago Medicines:                Monitored Anesthesia Care Procedure:                Pre-Anesthesia Assessment:                           - Prior to the procedure, a History and Physical                            was performed, and patient medications and                            allergies were reviewed. The patient's tolerance of                            previous anesthesia was also reviewed. The risks                            and benefits of the procedure and the sedation                            options and risks were discussed with the patient.                            All questions were answered, and informed consent                            was obtained. Prior Anticoagulants: The patient has                            taken no previous anticoagulant or antiplatelet                            agents. ASA Grade Assessment: II - A patient with                            mild systemic disease. After reviewing the risks                            and benefits, the patient was deemed in                            satisfactory condition to undergo the procedure.  After obtaining informed consent, the colonoscope                            was passed under direct vision. Throughout the                            procedure, the patient's blood pressure, pulse, and                            oxygen saturations were monitored continuously. The                            Colonoscope was introduced through the anus and                            advanced to the the  cecum, identified by                            appendiceal orifice and ileocecal valve. The                            ileocecal valve, appendiceal orifice, and rectum                            were photographed. The quality of the bowel                            preparation was good. The colonoscopy was performed                            without difficulty. The patient tolerated the                            procedure well. Scope In: 8:07:40 AM Scope Out: 8:23:17 AM Scope Withdrawal Time: 0 hours 13 minutes 39 seconds  Total Procedure Duration: 0 hours 15 minutes 37 seconds  Findings:                 The perianal and digital rectal examinations were                            normal.                           Eight sessile polyps were found in the rectum (1),                            sigmoid colon (4), descending colon (2) and                            transverse colon (1). The polyps were 6 to 8 mm in                            size. These polyps were removed with a cold snare.  Resection and retrieval were complete.                           A 4 mm polyp was found in the distal rectum. The                            polyp was sessile. The polyp was removed with a                            cold biopsy forceps. Resection and retrieval were                            complete.                           Multiple medium-mouthed diverticula were found in                            the left colon. There was no evidence of                            diverticular bleeding.                           Internal hemorrhoids were found during                            retroflexion. The hemorrhoids were moderate and                            Grade I (internal hemorrhoids that do not prolapse).                           The exam was otherwise without abnormality on                            direct and retroflexion views. Complications:            No immediate  complications. Estimated blood loss:                            None. Estimated Blood Loss:     Estimated blood loss: none. Impression:               - Eight 6 to 8 mm polyps in the rectum, in the                            sigmoid colon, in the descending colon and in the                            transverse colon, removed with a cold snare.                            Resected and retrieved.                           -  One 4 mm polyp in the distal rectum, removed with                            a cold biopsy forceps. Resected and retrieved.                           - Moderate diverticulosis in the left colon.                           - Internal hemorrhoids.                           - The examination was otherwise normal on direct                            and retroflexion views. Recommendation:           - Repeat colonoscopy, likely 3 years, after studies                            are complete for surveillance based on pathology                            results.                           - Patient has a contact number available for                            emergencies. The signs and symptoms of potential                            delayed complications were discussed with the                            patient. Return to normal activities tomorrow.                            Written discharge instructions were provided to the                            patient.                           - High fiber diet.                           - Continue present medications.                           - Await pathology results. Ladene Artist, MD 03/18/2021 8:30:39 AM This report has been signed electronically.

## 2021-03-18 NOTE — Progress Notes (Signed)
Pt's states no medical or surgical changes since previsit or office visit.  VS CW  

## 2021-03-18 NOTE — Progress Notes (Signed)
S nad trans to pacu 

## 2021-03-18 NOTE — Patient Instructions (Signed)
Follow a high fiber diet and resume previous medications. Repeat Colonoscopy likely in 3 years for surveillance.  Awaiting pathology results  YOU HAD AN ENDOSCOPIC PROCEDURE TODAY AT [facility]:   Refer to the procedure report that was given to you for any specific questions about what was found during the examination.  If the procedure report does not answer your questions, please call your gastroenterologist to clarify.  If you requested that your care partner not be given the details of your procedure findings, then the procedure report has been included in a sealed envelope for you to review at your convenience later.  YOU SHOULD EXPECT: Some feelings of bloating in the abdomen. Passage of more gas than usual.  Walking can help get rid of the air that was put into your GI tract during the procedure and reduce the bloating. If you had a lower endoscopy (such as a colonoscopy or flexible sigmoidoscopy) you may notice spotting of blood in your stool or on the toilet paper. If you underwent a bowel prep for your procedure, you may not have a normal bowel movement for a few days.  Please Note:  You might notice some irritation and congestion in your nose or some drainage.  This is from the oxygen used during your procedure.  There is no need for concern and it should clear up in a day or so.  SYMPTOMS TO REPORT IMMEDIATELY:  Following lower endoscopy (colonoscopy or flexible sigmoidoscopy):  Excessive amounts of blood in the stool  Significant tenderness or worsening of abdominal pains  Swelling of the abdomen that is new, acute  Fever of 100F or higher  For urgent or emergent issues, a gastroenterologist can be reached at any hour by calling [phone]. Do not use MyChart messaging for urgent concerns.    DIET:  We do recommend a small meal at first, but then you may proceed to your regular diet.  Drink plenty of fluids but you should avoid alcoholic beverages for 24  hours.  ACTIVITY:  You should plan to take it easy for the rest of today and you should NOT DRIVE or use heavy machinery until tomorrow (because of the sedation medicines used during the test).    FOLLOW UP: Our staff will call the number listed on your records 48-72 hours following your procedure to check on you and address any questions or concerns that you may have regarding the information given to you following your procedure. If we do not reach you, we will leave a message.  We will attempt to reach you two times.  During this call, we will ask if you have developed any symptoms of COVID 19. If you develop any symptoms (ie: fever, flu-like symptoms, shortness of breath, cough etc.) before then, please call [phone].  If you test positive for Covid 19 in the 2 weeks post procedure, please call and report this information to [place].    If any biopsies were taken you will be contacted by phone or by letter within the next 1-3 weeks.  Please call us at [phone] if you have not heard about the biopsies in 3 weeks.    SIGNATURES/CONFIDENTIALITY: You and/or your care partner have signed paperwork which will be entered into your electronic medical record.  These signatures attest to the fact that that the information above on your After Visit Summary has been reviewed and is understood.  Full responsibility of the confidentiality of this discharge information lies with you and/or  your care-partner.

## 2021-03-18 NOTE — Progress Notes (Signed)
Called to room to assist during endoscopic procedure.  Patient ID and intended procedure confirmed with present staff. Received instructions for my participation in the procedure from the performing physician.  

## 2021-03-23 ENCOUNTER — Telehealth: Payer: Self-pay

## 2021-03-23 NOTE — Telephone Encounter (Signed)
Second post procedure follow up call, no answer 

## 2021-03-23 NOTE — Telephone Encounter (Signed)
Left message on follow up call. 

## 2021-03-28 ENCOUNTER — Encounter: Payer: Self-pay | Admitting: Gastroenterology

## 2021-10-22 DIAGNOSIS — R7989 Other specified abnormal findings of blood chemistry: Secondary | ICD-10-CM | POA: Diagnosis not present

## 2021-10-22 DIAGNOSIS — I10 Essential (primary) hypertension: Secondary | ICD-10-CM | POA: Diagnosis not present

## 2021-10-22 DIAGNOSIS — Z1212 Encounter for screening for malignant neoplasm of rectum: Secondary | ICD-10-CM | POA: Diagnosis not present

## 2021-10-22 DIAGNOSIS — R7301 Impaired fasting glucose: Secondary | ICD-10-CM | POA: Diagnosis not present

## 2021-10-29 DIAGNOSIS — Z1339 Encounter for screening examination for other mental health and behavioral disorders: Secondary | ICD-10-CM | POA: Diagnosis not present

## 2021-10-29 DIAGNOSIS — M19072 Primary osteoarthritis, left ankle and foot: Secondary | ICD-10-CM | POA: Diagnosis not present

## 2021-10-29 DIAGNOSIS — Z1331 Encounter for screening for depression: Secondary | ICD-10-CM | POA: Diagnosis not present

## 2021-10-29 DIAGNOSIS — D692 Other nonthrombocytopenic purpura: Secondary | ICD-10-CM | POA: Diagnosis not present

## 2021-10-29 DIAGNOSIS — R82998 Other abnormal findings in urine: Secondary | ICD-10-CM | POA: Diagnosis not present

## 2021-10-29 DIAGNOSIS — Z23 Encounter for immunization: Secondary | ICD-10-CM | POA: Diagnosis not present

## 2021-10-29 DIAGNOSIS — I700 Atherosclerosis of aorta: Secondary | ICD-10-CM | POA: Diagnosis not present

## 2021-10-29 DIAGNOSIS — J4530 Mild persistent asthma, uncomplicated: Secondary | ICD-10-CM | POA: Diagnosis not present

## 2021-10-29 DIAGNOSIS — Z0000 Encounter for general adult medical examination without abnormal findings: Secondary | ICD-10-CM | POA: Diagnosis not present

## 2021-10-29 DIAGNOSIS — H9313 Tinnitus, bilateral: Secondary | ICD-10-CM | POA: Diagnosis not present

## 2021-10-29 DIAGNOSIS — I10 Essential (primary) hypertension: Secondary | ICD-10-CM | POA: Diagnosis not present

## 2022-01-13 DIAGNOSIS — L821 Other seborrheic keratosis: Secondary | ICD-10-CM | POA: Diagnosis not present

## 2022-01-13 DIAGNOSIS — L814 Other melanin hyperpigmentation: Secondary | ICD-10-CM | POA: Diagnosis not present

## 2022-01-13 DIAGNOSIS — L820 Inflamed seborrheic keratosis: Secondary | ICD-10-CM | POA: Diagnosis not present

## 2022-01-13 DIAGNOSIS — Z85828 Personal history of other malignant neoplasm of skin: Secondary | ICD-10-CM | POA: Diagnosis not present

## 2022-02-08 ENCOUNTER — Telehealth: Payer: PPO | Admitting: Physician Assistant

## 2022-02-08 DIAGNOSIS — J069 Acute upper respiratory infection, unspecified: Secondary | ICD-10-CM

## 2022-02-08 MED ORDER — FLUTICASONE PROPIONATE 50 MCG/ACT NA SUSP: 2.0000 | Freq: Every day | NASAL | 6 refills | Status: AC

## 2022-02-08 MED ORDER — BENZONATATE 100 MG PO CAPS: 100.0000 mg | ORAL_CAPSULE | Freq: Three times a day (TID) | ORAL | 0 refills | Status: DC | PRN

## 2022-02-08 MED ORDER — ALBUTEROL SULFATE HFA 108 (90 BASE) MCG/ACT IN AERS: 2.0000 | INHALATION_SPRAY | Freq: Four times a day (QID) | RESPIRATORY_TRACT | 0 refills | Status: AC | PRN

## 2022-02-08 NOTE — Patient Instructions (Signed)
Briana Hull, thank you for joining Lenise Arena Ward, PA-C for today's virtual visit.  While this provider is not your primary care provider (PCP), if your PCP is located in our provider database this encounter information will be shared with them immediately following your visit.   [facility] account gives you access to today's visit and all your visits, tests, and labs performed at [facility] " click here if you don't have a [facility] account or go to [email or web address]  Consent: (Patient) Briana Hull provided verbal consent for this virtual visit at the beginning of the encounter.  Current Medications:  Current Outpatient Medications:    albuterol (VENTOLIN HFA) 108 (90 Base) MCG/ACT inhaler, Inhale 2 puffs into the lungs every 6 (six) hours as needed for wheezing or shortness of breath., Disp: 8 g, Rfl: 0   benzonatate (TESSALON) 100 MG capsule, Take 1 capsule (100 mg total) by mouth 3 (three) times daily as needed for cough., Disp: 20 capsule, Rfl: 0   fluticasone (FLONASE) 50 MCG/ACT nasal spray, Place 2 sprays into both nostrils daily., Disp: 16 g, Rfl: 6   albuterol (PROVENTIL HFA;VENTOLIN HFA) 108 (90 BASE) MCG/ACT inhaler, Inhale 1-2 puffs into the lungs every 6 (six) hours as needed for wheezing or shortness of breath. (Patient not taking: Reported on 03/05/2021), Disp: 1 Inhaler, Rfl: 2   atenolol (TENORMIN) 25 MG tablet, TAKE 1 TABLET (25 MG TOTAL) BY MOUTH DAILY., Disp: 90 tablet, Rfl: 2   Biotin 1000 MCG tablet, Take 1,000 mcg by mouth 3 (three) times daily., Disp: , Rfl:    Cholecalciferol (VITAMIN D3) 50 MCG (2000 UT) TABS, 1 tablet, Disp: , Rfl:    Cyanocobalamin (B-12) 100 MCG TABS, Take 1 tablet by mouth daily., Disp: , Rfl:    cyclobenzaprine (FLEXERIL) 10 MG tablet, Take 10 mg by mouth at bedtime., Disp: , Rfl:    Docusate Sodium (EQ STOOL SOFTENER PO), Take by mouth., Disp: , Rfl:    fluticasone (FLONASE) 50 MCG/ACT nasal  spray, Place 2 sprays into both nostrils daily as needed for allergies or rhinitis. (Patient not taking: Reported on 03/29/2019), Disp: 16 g, Rfl: 5   hydroxypropyl methylcellulose / hypromellose (ISOPTO TEARS / GONIOVISC) 2.5 % ophthalmic solution, 1 drop., Disp: , Rfl:    Inulin (FIBER CHOICE PO), Take by mouth., Disp: , Rfl:    Magnesium 500 MG TABS, one po every day, Disp: , Rfl:    Peppermint Oil (IBGARD PO), Take 1 capsule by mouth as needed. She will take from 0-6 tablets daily. She usually takes 3-4 daily., Disp: , Rfl:    TRAMADOL HCL PO, , Disp: , Rfl:    valACYclovir (VALTREX) 500 MG tablet, TAKE 1 TABLET BY MOUTH TWICE A DAY, Disp: 30 tablet, Rfl: 2   Medications ordered in this encounter:  Meds ordered this encounter  Medications   benzonatate (TESSALON) 100 MG capsule    Sig: Take 1 capsule (100 mg total) by mouth 3 (three) times daily as needed for cough.    Dispense:  20 capsule    Refill:  0    Order Specific Question:   Supervising Provider    Answer:   Bari Mantis   albuterol (VENTOLIN HFA) 108 (90 Base) MCG/ACT inhaler    Sig: Inhale 2 puffs into the lungs every 6 (six) hours as needed for wheezing or shortness of breath.    Dispense:  8 g    Refill:  0  Order Specific Question:   Supervising Provider    Answer:   Chase Picket [[id number]]   fluticasone (FLONASE) 50 MCG/ACT nasal spray    Sig: Place 2 sprays into both nostrils daily.    Dispense:  16 g    Refill:  6    Order Specific Question:   Supervising Provider    Answer:   Chase Picket [id number]     *If you need refills on other medications prior to your next appointment, please contact your pharmacy*  Follow-Up: Call back or seek an in-person evaluation if the symptoms worsen or if the condition fails to improve as anticipated.  [facility] [phone]  Other Instructions Continue to drink plenty of fluids.  Take tessalon as needed for cough.  Use Flonase once  daily.  Use albuterol inhaler as needed for shortness of breath or wheezing.  Follow up with an in person visit if you develop new or worsening symptoms.    If you have been instructed to have an in-person evaluation today at a local Urgent Care facility, please use the link below. It will take you to a list of all of our available [facility], including address, phone number and hours of operation. Please do not delay care.  [facility]  If you or a family member do not have a primary care provider, use the link below to schedule a visit and establish care. When you choose a [facility] primary care physician or advanced practice provider, you gain a long-term partner in health. Find a Primary Care Provider  Learn more about [facility]'s in-office and virtual care options: [facility]

## 2022-02-08 NOTE — Progress Notes (Signed)
Virtual Visit Consent   Briana Hull, you are scheduled for a virtual visit with a [facility] provider today. Just as with appointments in the office, your consent must be obtained to participate. Your consent will be active for this visit and any virtual visit you may have with one of our providers in the next 365 days. If you have a MyChart account, a copy of this consent can be sent to you electronically.  As this is a virtual visit, video technology does not allow for your provider to perform a traditional examination. This may limit your provider's ability to fully assess your condition. If your provider identifies any concerns that need to be evaluated in person or the need to arrange testing (such as labs, EKG, etc.), we will make arrangements to do so. Although advances in technology are sophisticated, we cannot ensure that it will always work on either your end or our end. If the connection with a video visit is poor, the visit may have to be switched to a telephone visit. With either a video or telephone visit, we are not always able to ensure that we have a secure connection.  By engaging in this virtual visit, you consent to the provision of healthcare and authorize for your insurance to be billed (if applicable) for the services provided during this visit. Depending on your insurance coverage, you may receive a charge related to this service.  I need to obtain your verbal consent now. Are you willing to proceed with your visit today? Briana Hull has provided verbal consent on 02/08/2022 for a virtual visit (video or telephone). Lenise Arena Ward, PA-C  Date: 02/08/2022 6:20 PM  Virtual Visit via Video Note   I, Lenise Arena Ward, connected with  Briana Hull  ([id number], 09-Sep-1950) on 02/08/22 at  6:15 PM EST by a video-enabled telemedicine application and verified that I am speaking with the correct person using two identifiers.  Location: Patient: Virtual Visit Location  Patient: Home Provider: Virtual Visit Location Provider: Home Office   I discussed the limitations of evaluation and management by telemedicine and the availability of in person appointments. The patient expressed understanding and agreed to proceed.    History of Present Illness: Briana Hull is a 72 y.o. who identifies as a female who was assigned female at birth, and is being seen today for congestion, cough, body aches that started 02/08/2022.  She took a home test which was negative.  She did have her flu vaccine.  She reports postnasal drip.  She has been drinking plenty of fluids.  She has been taking tylenol with some relief.  She denies shortness of breath. She is out of her inhaler. She reports fever  HPI: HPI  Problems:  Patient Active Problem List   Diagnosis Date Noted   Basal cell carcinoma of nose 06/22/2018   Low back pain 05/30/2016   Preventative health care 12/22/2015   Sleep disturbance 12/22/2015   Skin lesion 12/22/2015   Allergic asthma    Headache 09/08/2015   Chronic fatigue 09/08/2015   Obesity 12/02/2014   Hypokalemia 12/13/2013   Family history of ovarian cancer 11/22/2013   Gastroesophageal reflux disease without esophagitis 11/22/2013   Essential hypertension 11/22/2013   Rhinitis, allergic 11/22/2013    Allergies:  Allergies  Allergen Reactions   Other Diarrhea and Nausea And Vomiting    MSG   Mercury     Other reaction(s): if eats fish tastes metal    Codeine  Nausea And Vomiting   Hydrocodone Other (See Comments)    Vomiting   Medications:  Current Outpatient Medications:    albuterol (VENTOLIN HFA) 108 (90 Base) MCG/ACT inhaler, Inhale 2 puffs into the lungs every 6 (six) hours as needed for wheezing or shortness of breath., Disp: 8 g, Rfl: 0   benzonatate (TESSALON) 100 MG capsule, Take 1 capsule (100 mg total) by mouth 3 (three) times daily as needed for cough., Disp: 20 capsule, Rfl: 0   fluticasone (FLONASE) 50 MCG/ACT nasal spray,  Place 2 sprays into both nostrils daily., Disp: 16 g, Rfl: 6   albuterol (PROVENTIL HFA;VENTOLIN HFA) 108 (90 BASE) MCG/ACT inhaler, Inhale 1-2 puffs into the lungs every 6 (six) hours as needed for wheezing or shortness of breath. (Patient not taking: Reported on 03/05/2021), Disp: 1 Inhaler, Rfl: 2   atenolol (TENORMIN) 25 MG tablet, TAKE 1 TABLET (25 MG TOTAL) BY MOUTH DAILY., Disp: 90 tablet, Rfl: 2   Biotin 1000 MCG tablet, Take 1,000 mcg by mouth 3 (three) times daily., Disp: , Rfl:    Cholecalciferol (VITAMIN D3) 50 MCG (2000 UT) TABS, 1 tablet, Disp: , Rfl:    Cyanocobalamin (B-12) 100 MCG TABS, Take 1 tablet by mouth daily., Disp: , Rfl:    cyclobenzaprine (FLEXERIL) 10 MG tablet, Take 10 mg by mouth at bedtime., Disp: , Rfl:    Docusate Sodium (EQ STOOL SOFTENER PO), Take by mouth., Disp: , Rfl:    fluticasone (FLONASE) 50 MCG/ACT nasal spray, Place 2 sprays into both nostrils daily as needed for allergies or rhinitis. (Patient not taking: Reported on 03/29/2019), Disp: 16 g, Rfl: 5   hydroxypropyl methylcellulose / hypromellose (ISOPTO TEARS / GONIOVISC) 2.5 % ophthalmic solution, 1 drop., Disp: , Rfl:    Inulin (FIBER CHOICE PO), Take by mouth., Disp: , Rfl:    Magnesium 500 MG TABS, one po every day, Disp: , Rfl:    Peppermint Oil (IBGARD PO), Take 1 capsule by mouth as needed. She will take from 0-6 tablets daily. She usually takes 3-4 daily., Disp: , Rfl:    TRAMADOL HCL PO, , Disp: , Rfl:    valACYclovir (VALTREX) 500 MG tablet, TAKE 1 TABLET BY MOUTH TWICE A DAY, Disp: 30 tablet, Rfl: 2  Observations/Objective: Patient is well-developed, well-nourished in no acute distress.  Resting comfortably at home.  Head is normocephalic, atraumatic.  No labored breathing.  Speech is clear and coherent with logical content.  Patient is alert and oriented at baseline.    Assessment and Plan: 1. Viral upper respiratory tract infection - benzonatate (TESSALON) 100 MG capsule; Take 1 capsule  (100 mg total) by mouth 3 (three) times daily as needed for cough.  Dispense: 20 capsule; Refill: 0 - albuterol (VENTOLIN HFA) 108 (90 Base) MCG/ACT inhaler; Inhale 2 puffs into the lungs every 6 (six) hours as needed for wheezing or shortness of breath.  Dispense: 8 g; Refill: 0 - fluticasone (FLONASE) 50 MCG/ACT nasal spray; Place 2 sprays into both nostrils daily.  Dispense: 16 g; Refill: 6  Pt speaking in complete sentences, in no acute distress.  Discussed supportive care.  Precautions given for in person evaluation.   Follow Up Instructions: I discussed the assessment and treatment plan with the patient. The patient was provided an opportunity to ask questions and all were answered. The patient agreed with the plan and demonstrated an understanding of the instructions.  A copy of instructions were sent to the patient via MyChart unless otherwise noted below.  The patient was advised to call back or seek an in-person evaluation if the symptoms worsen or if the condition fails to improve as anticipated.  Time:  I spent 12 minutes with the patient via telehealth technology discussing the above problems/concerns.    Lenise Arena Ward, PA-C

## 2022-02-10 ENCOUNTER — Other Ambulatory Visit: Payer: Self-pay | Admitting: Internal Medicine

## 2022-02-10 DIAGNOSIS — Z1231 Encounter for screening mammogram for malignant neoplasm of breast: Secondary | ICD-10-CM

## 2022-02-14 DIAGNOSIS — H60311 Diffuse otitis externa, right ear: Secondary | ICD-10-CM | POA: Diagnosis not present

## 2022-04-01 ENCOUNTER — Ambulatory Visit
Admission: RE | Admit: 2022-04-01 | Discharge: 2022-04-01 | Disposition: A | Discharge status: Home / Self Care | Payer: PPO | Source: Ambulatory Visit

## 2022-04-01 DIAGNOSIS — Z1231 Encounter for screening mammogram for malignant neoplasm of breast: Secondary | ICD-10-CM | POA: Diagnosis not present

## 2022-05-25 DIAGNOSIS — R058 Other specified cough: Secondary | ICD-10-CM | POA: Diagnosis not present

## 2022-05-25 DIAGNOSIS — J4530 Mild persistent asthma, uncomplicated: Secondary | ICD-10-CM | POA: Diagnosis not present

## 2022-05-25 DIAGNOSIS — B009 Herpesviral infection, unspecified: Secondary | ICD-10-CM | POA: Diagnosis not present

## 2022-05-25 DIAGNOSIS — J209 Acute bronchitis, unspecified: Secondary | ICD-10-CM | POA: Diagnosis not present

## 2022-05-25 DIAGNOSIS — J302 Other seasonal allergic rhinitis: Secondary | ICD-10-CM | POA: Diagnosis not present

## 2022-05-25 DIAGNOSIS — H6691 Otitis media, unspecified, right ear: Secondary | ICD-10-CM | POA: Diagnosis not present

## 2022-06-16 ENCOUNTER — Other Ambulatory Visit: Payer: Self-pay | Admitting: Otolaryngology

## 2022-06-16 DIAGNOSIS — H6981 Other specified disorders of Eustachian tube, right ear: Secondary | ICD-10-CM | POA: Diagnosis not present

## 2022-06-16 DIAGNOSIS — H903 Sensorineural hearing loss, bilateral: Secondary | ICD-10-CM | POA: Diagnosis not present

## 2022-06-16 DIAGNOSIS — H9201 Otalgia, right ear: Secondary | ICD-10-CM | POA: Diagnosis not present

## 2022-06-16 DIAGNOSIS — J310 Chronic rhinitis: Secondary | ICD-10-CM | POA: Diagnosis not present

## 2022-06-16 DIAGNOSIS — J342 Deviated nasal septum: Secondary | ICD-10-CM | POA: Diagnosis not present

## 2022-06-16 DIAGNOSIS — J329 Chronic sinusitis, unspecified: Secondary | ICD-10-CM

## 2022-06-16 DIAGNOSIS — J343 Hypertrophy of nasal turbinates: Secondary | ICD-10-CM | POA: Diagnosis not present

## 2022-07-12 ENCOUNTER — Ambulatory Visit
Admission: RE | Admit: 2022-07-12 | Discharge: 2022-07-12 | Disposition: A | Discharge status: Home / Self Care | Payer: PPO | Source: Ambulatory Visit | Attending: Otolaryngology | Admitting: Otolaryngology

## 2022-07-12 DIAGNOSIS — J329 Chronic sinusitis, unspecified: Secondary | ICD-10-CM

## 2022-07-12 DIAGNOSIS — J3489 Other specified disorders of nose and nasal sinuses: Secondary | ICD-10-CM | POA: Diagnosis not present

## 2022-07-19 DIAGNOSIS — J342 Deviated nasal septum: Secondary | ICD-10-CM | POA: Diagnosis not present

## 2022-07-19 DIAGNOSIS — J343 Hypertrophy of nasal turbinates: Secondary | ICD-10-CM | POA: Diagnosis not present

## 2022-07-19 DIAGNOSIS — J310 Chronic rhinitis: Secondary | ICD-10-CM | POA: Diagnosis not present

## 2022-09-24 IMAGING — MG MM DIGITAL SCREENING BILAT W/ TOMO AND CAD
8 series · 8 of 24 positions shown · non-contrast
Comparison: Previous exam(s).

CLINICAL DATA: Screening.

EXAM:
DIGITAL SCREENING BILATERAL MAMMOGRAM WITH TOMOSYNTHESIS AND CAD
TECHNIQUE: Bilateral screening digital craniocaudal and mediolateral oblique
mammograms were obtained. Bilateral screening digital breast
tomosynthesis was performed. The images were evaluated with
computer-aided detection.

[L MLO synth-2D]
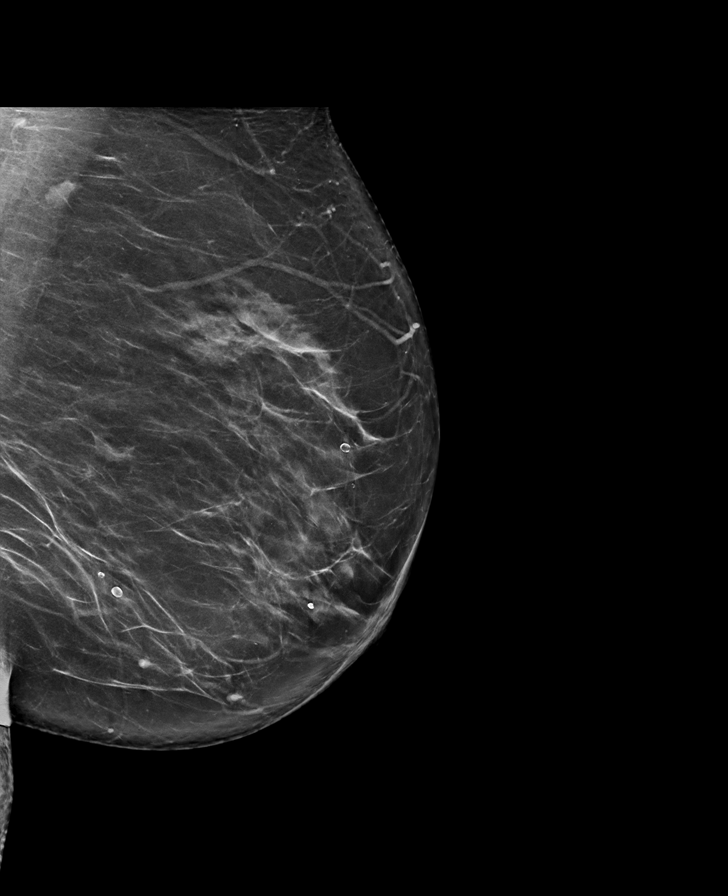

[R CC synth-2D]
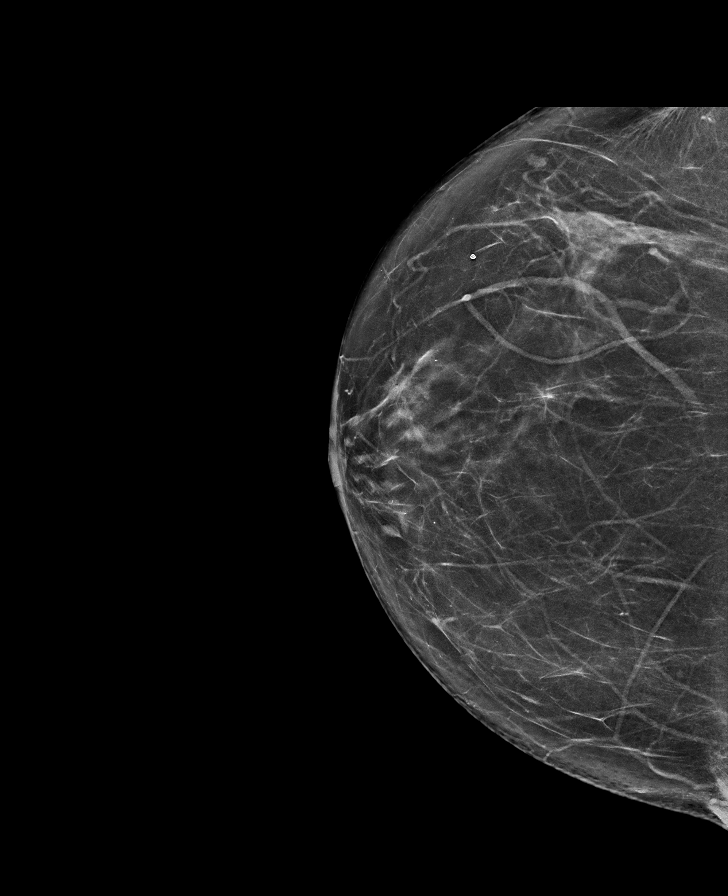

[L CC synth-2D]
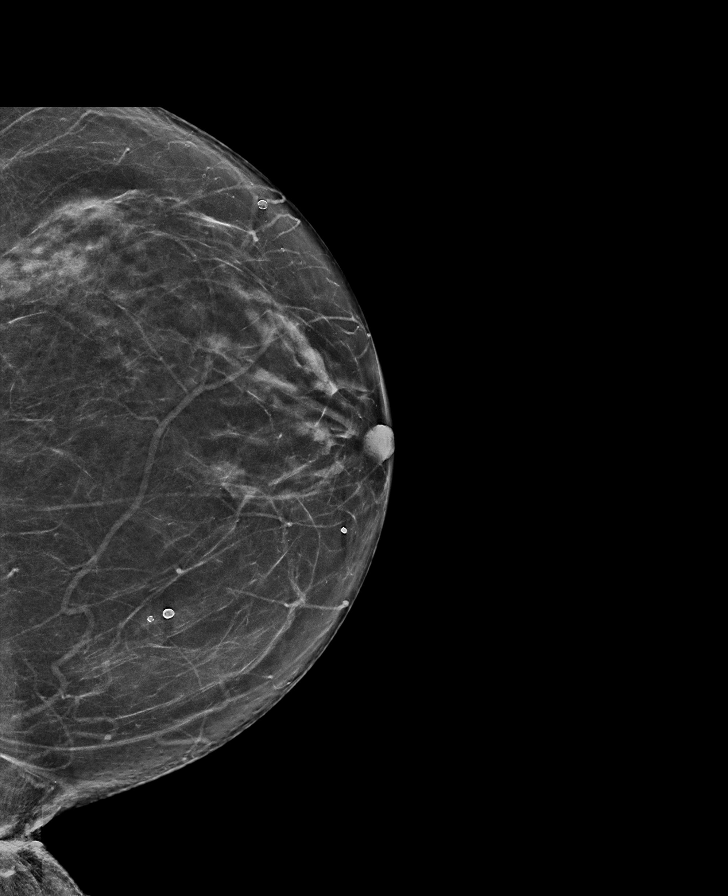

[R MLO synth-2D]
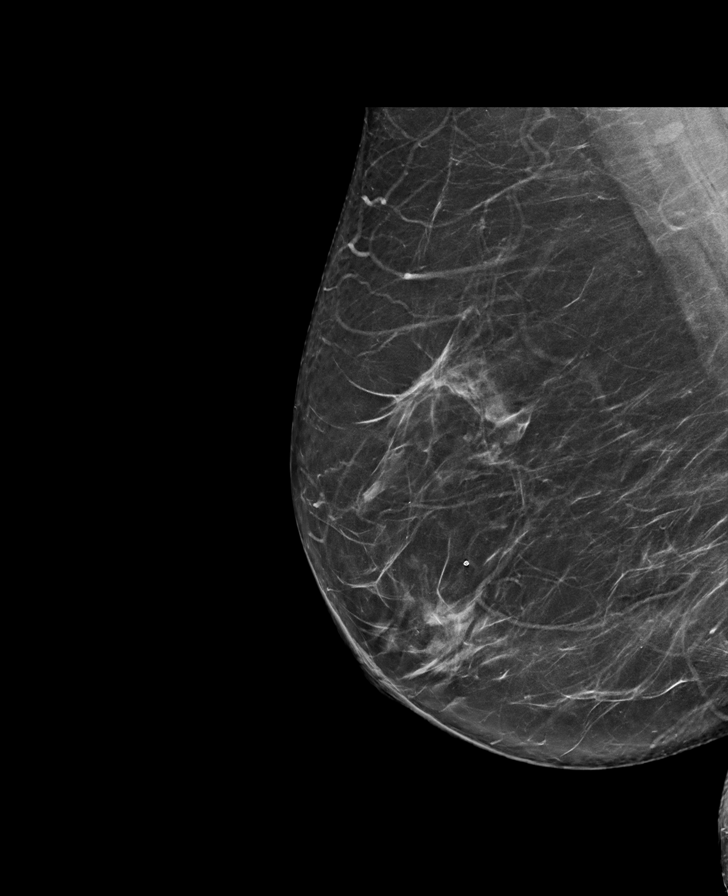

[R MLO tomo · tomo slice 43/85.0]
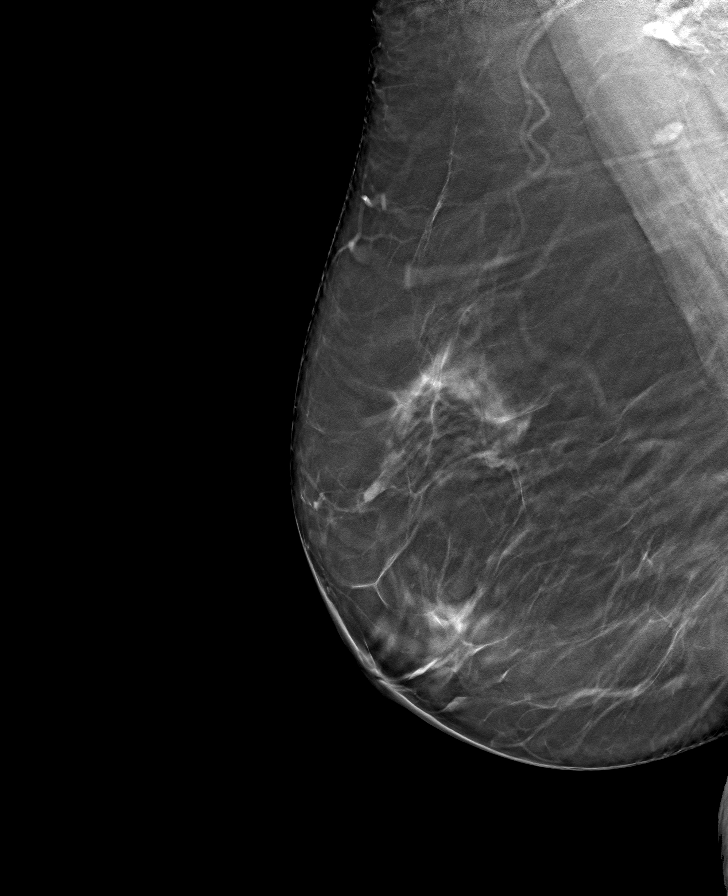

[L MLO tomo · tomo slice 44/87.0]
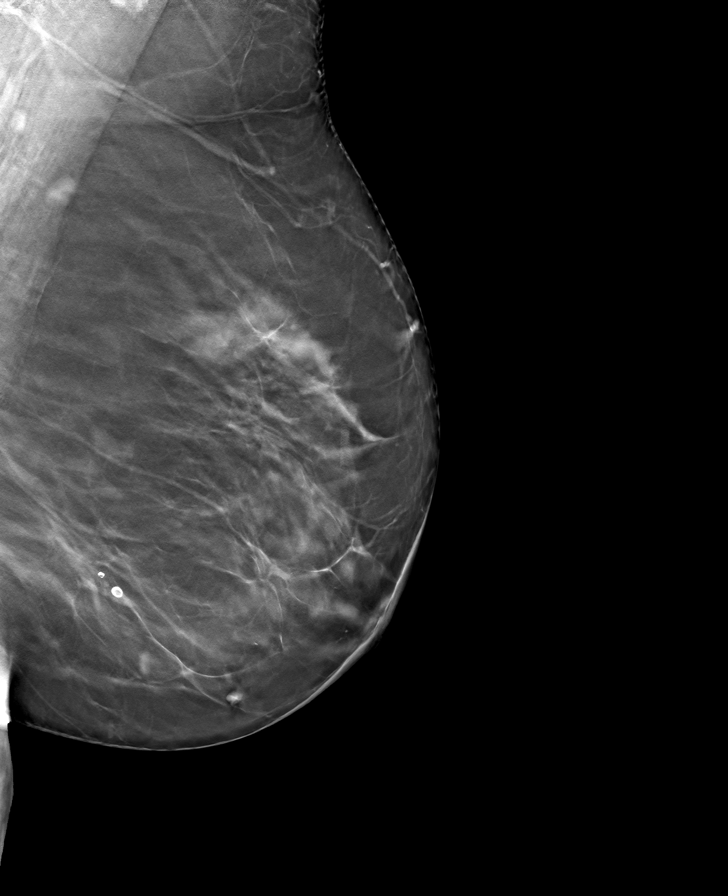

[R CC tomo · tomo slice 39/76.0]
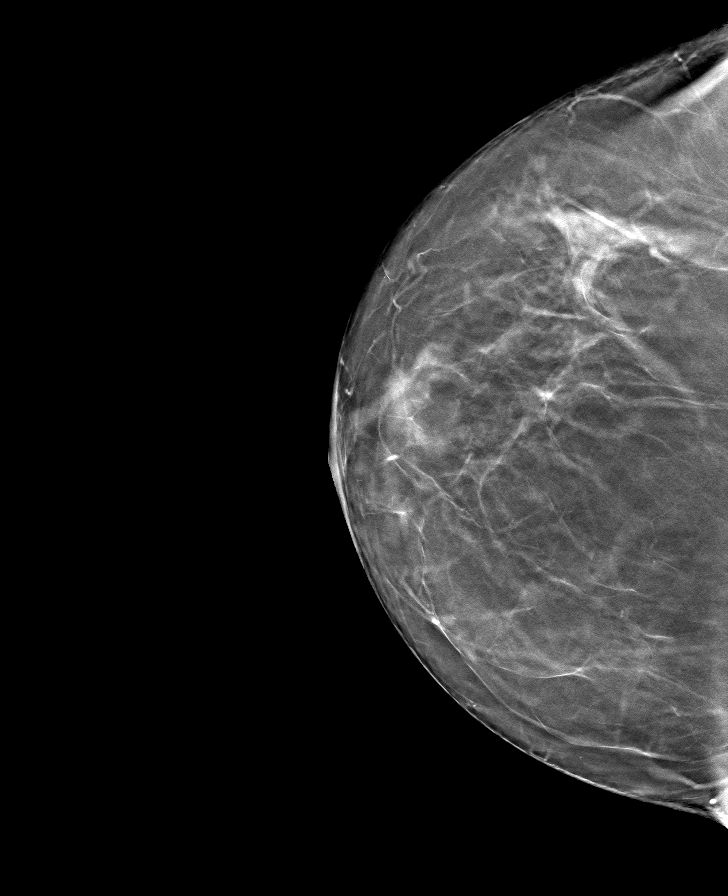

[L CC tomo · tomo slice 37/74.0]
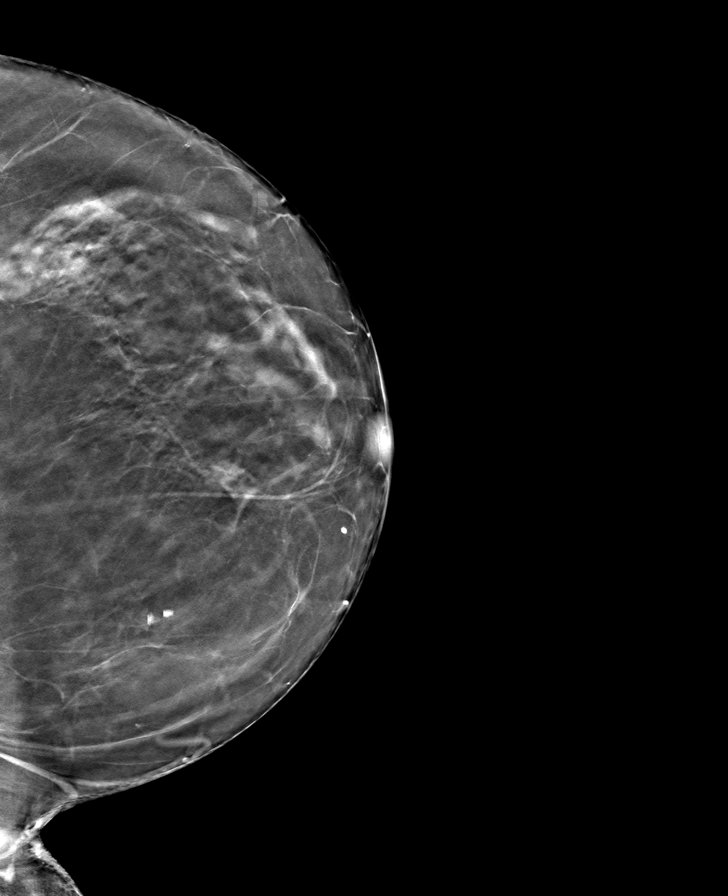

[8 of 24 positions shown; findings below may reference images not displayed]

ACR Breast Density Category b: There are scattered areas of
fibroglandular density.
FINDINGS: There are no findings suspicious for malignancy.
IMPRESSION: No mammographic evidence of malignancy. A result letter of this
screening mammogram will be mailed directly to the patient.

RECOMMENDATION:
Screening mammogram in one year. (Code:51-O-LD2)

BI-RADS CATEGORY  1: Negative.

## 2022-10-14 ENCOUNTER — Ambulatory Visit: Payer: PPO | Admitting: Neurology

## 2022-10-14 ENCOUNTER — Encounter: Payer: Self-pay | Admitting: Neurology

## 2022-10-14 VITALS — BP 148/84 | HR 75 | Ht 63.0 in | Wt 209.0 lb

## 2022-10-14 DIAGNOSIS — R519 Headache, unspecified: Secondary | ICD-10-CM

## 2022-10-14 DIAGNOSIS — M542 Cervicalgia: Secondary | ICD-10-CM | POA: Diagnosis not present

## 2022-10-14 DIAGNOSIS — R200 Anesthesia of skin: Secondary | ICD-10-CM

## 2022-10-14 DIAGNOSIS — M5481 Occipital neuralgia: Secondary | ICD-10-CM

## 2022-10-14 DIAGNOSIS — M5412 Radiculopathy, cervical region: Secondary | ICD-10-CM | POA: Diagnosis not present

## 2022-10-14 DIAGNOSIS — M7918 Myalgia, other site: Secondary | ICD-10-CM | POA: Diagnosis not present

## 2022-10-14 DIAGNOSIS — R29898 Other symptoms and signs involving the musculoskeletal system: Secondary | ICD-10-CM

## 2022-10-14 DIAGNOSIS — H5710 Ocular pain, unspecified eye: Secondary | ICD-10-CM

## 2022-10-14 MED ORDER — CYCLOBENZAPRINE HCL 10 MG PO TABS: 5.0000 mg | ORAL_TABLET | Freq: Two times a day (BID) | ORAL | 4 refills | Status: AC | PRN

## 2022-10-14 NOTE — Progress Notes (Signed)
[facility]    Provider:  Dr Lucia Gaskins Requesting Provider: Newman Pies, MD Primary Care Provider:  Gaspar Garbe, MD  CC:  cervicalgia, neck pain, weakness and numbness in hands and leg  HPI:  Briana Hull is a 72 y.o. female here as requested by Newman Pies, MD for persistent face pain.  From Dr. Jodean Lima MD.  I reviewed his notes, a 72 year old female who he has seen several times, she was complaining of chronic nasal congestion, left periorbital pain, and right ear pressure and pain.  She was diagnosed with right ear eustachian tube dysfunction and referred right otalgia.  She was noted to have mucosal congestion nasal, septal deviation, and bilateral inferior turbinate hypertrophy.  She was treated with Flonase and Valsalva exercise.  Her subsequent CT scan showed no significant acute or chronic sinusitis.  Patient returns today complaining of persistent face pain around her periorbital areas.  She has intermittent right ear pressure.  She denies any fever visual change.  Diagnosed with chronic rhinitis and nasal mucosal congestion, nasal septal deviation, bilateral inferior turbinate hypertrophy.  Persistent facial pain.  She has a history of skin cancer on her nose requiring radiation treatment in the past.  The nasal endoscopy findings and the CT images were reviewed with her, continue Flonase.  Patient is here alone for evaluation of persistent facial pain. She reports its actually headaches and she thinks it comes from her neck. She reports pain on the left side of head and forehead area. She was having very unusual severe pain about a week or so ago. She states it started in her ear and she was evaluated but didn't find anything. She has shoulder pain, neck pain. States she has problems with her back. She saw ENT and states they couldn't find anything w/ sinus so she was referred here.  She is having pain in her neck, she can't even get comfortable lying down, had it for years  and worsening. She has sciatica, they wanted to do surgery but she thought they should image her whole neuro axis and also that doctors only want to hear about one thing. Explained why this I the case and apoligized I understand I am a patient and a doctor too. Right ear but left side of her head. Headache every day. She has a stressful job. Right ear into her ear the pain was tremendous sharp, a few secs but repetitively then she couldn;t hear on the right. She is just now improving a pain every now and then when she got it unstopped (dxed with eustacian tube dysfuntion she went to a dentist and then an ENT and then back to a doctor). She has an appointment for blood work. Showed her occipital nerve have labs on 27th with [facility] can wait for those. She sees dr Wylene Simmer on 10/4. Starts in the neck on the left and shoots to her eye and across brow. She has multiple around the head and pain in the left eye and brow. And temple. Very tight muscles, muscles not happy, flexeril helped. Brother died of a brain tumor. She isn;t hap[py with medical treatment. The physical therapists didn;t do anything for her.  She has numbness in the hands, she drops things, weakness, cannot walk well, gait abnormality, weakness.  Reviewed notes, labs and imaging from outside physicians, which showed:  07/19/2022: CT maxillofacial: CLINICAL DATA:  Bilateral ear pain, hearing loss, pressure and nasal dryness since   EXAM: CT MAXILLOFACIAL WITHOUT CONTRAST   TECHNIQUE:  Multidetector CT imaging of the maxillofacial structures was performed. Multiplanar CT image reconstructions were also generated.   RADIATION DOSE REDUCTION: This exam was performed according to the departmental dose-optimization program which includes automated exposure control, adjustment of the mA and/or kV according to patient size and/or use of iterative reconstruction technique.   COMPARISON:  None Available.   FINDINGS: Osseous: No fracture or  mandibular dislocation. No destructive process.   Orbits: Negative. No traumatic or inflammatory finding.   Sinuses: Mild inferior right maxillary sinus mucosal thickening. Otherwise, sinuses are largely clear.  I do not see any further information in Care Everywhere.   Soft tissues: Negative.   Limited intracranial: No significant or unexpected finding.   IMPRESSION: Mild inferior right maxillary sinus mucosal thickening. Otherwise, sinuses are largely clear.   Recent blood work the last blood work I have was from October 2020 which showed BUN 17, creatinine 0.85,  From of a review of medications, those use that can be also used in headache and migraine management or other headache syndromes include: Atenolol, Soma, Flexeril, magnesium tablets, Robaxin, tramadol, trazodone.  From patient information:     Latest Ref Rng & Units 11/06/2018    3:53 PM 06/29/2018   12:49 PM 12/22/2015    9:01 AM  CMP  Glucose 70 - 99 mg/dL   295   BUN 6 - 23 mg/dL 17  14  13    Creatinine 0.40 - 1.20 mg/dL 6.21  3.08  6.57   Sodium 135 - 145 mEq/L   140   Potassium 3.5 - 5.1 mEq/L   5.0   Chloride 96 - 112 mEq/L   106   CO2 19 - 32 mEq/L   29   Calcium 8.4 - 10.5 mg/dL   9.7   Total Protein 6.0 - 8.3 g/dL   6.5   Total Bilirubin 0.2 - 1.2 mg/dL   0.6   Alkaline Phos 39 - 117 U/L   58   AST 0 - 37 U/L   16   ALT 0 - 35 U/L   22       Latest Ref Rng & Units 12/22/2015    9:01 AM 08/24/2015   12:25 PM 11/20/2013    3:57 PM  CBC  WBC 4.0 - 10.5 K/uL 6.1  6.1  7.3   Hemoglobin 12.0 - 15.0 g/dL 84.6  96.2  95.2   Hematocrit 36.0 - 46.0 % 41.6  39.9  39.7   Platelets 150.0 - 400.0 K/uL 262.0  272.0  277.0      Review of Systems: Patient complains of symptoms per HPI as well as the following symptoms neck pain. Pertinent negatives and positives per HPI. All others negative.   Social History   Socioeconomic History   Marital status: Divorced    Spouse name: Not on file   Number of  children: 0   Years of education: College   Highest education level: Not on file  Occupational History   Occupation: [employer]    Comment: Counselor/Sales  Tobacco Use   Smoking status: Former    Current packs/day: 0.00    Types: Cigarettes    Quit date: 09/07/1981    Years since quitting: 41.1   Smokeless tobacco: Never   Tobacco comments:    Quit >30 yrs  Vaping Use   Vaping status: Never Used  Substance and Sexual Activity   Alcohol use: Yes    Comment: 0-2 drinks daily   Drug use: No  Sexual activity: Not Currently  Other Topics Concern   Not on file  Social History Narrative   Lives alone   Caffeine: 1/2 cup the morning, sometimes one during the day   Social Determinants of Health   Financial Resource Strain: Not on file  Food Insecurity: Not on file  Transportation Needs: No Transportation Needs (06/22/2018)   PRAPARE - Administrator, Civil Service (Medical): No    Lack of Transportation (Non-Medical): No  Physical Activity: Not on file  Stress: Not on file  Social Connections: Not on file  Intimate Partner Violence: Not At Risk (06/22/2018)   Humiliation, Afraid, Rape, and Kick questionnaire    Fear of Current or Ex-Partner: No    Emotionally Abused: No    Physically Abused: No    Sexually Abused: No    Family History  Problem Relation Age of Onset   Breast cancer Mother 33       Deceased   Stroke Mother    Hypertension Mother    Cancer Mother        Breast cancer   Renal cancer Father    Lung cancer Father 55       Deceased   Melanoma Father    Cancer Father    Migraines Sister    Ovarian cancer Sister    Diabetes Brother    Brain cancer Brother        G-Blastoma   Lung cancer Brother    Heart disease Maternal Grandmother    Cancer Paternal Grandfather    Stomach cancer Paternal Grandfather    Cancer - Other Maternal Aunt        Pancreatic   Heart disease Maternal Aunt    Heart attack Maternal Aunt    Lung cancer  Maternal Aunt    Cancer Paternal Aunt    Melanoma Paternal Aunt    Multiple myeloma Paternal Aunt    Rectal cancer Cousin    Colon cancer Neg Hx    Esophageal cancer Neg Hx    Colon polyps Neg Hx     Past Medical History:  Diagnosis Date   Allergic asthma    Allergy    seasonal/multiple environmental allergies present   Basal cell carcinoma    nose   Cataract    removed both eyes   Degenerative joint disease    in knees, hips,ankles   Diverticulosis    Duodenal ulcer    Fatty liver    per pt   Herpes simplex of female genitalia    History of chicken pox    History of radiation therapy 07/09/18- 08/20/18   Nose, 30 fx of 2 Gy for a total of 60 Gy   Hypertension    Hypoglycemia    Migraine    Seasonal allergies    Tubular adenoma of colon 2016    Patient Active Problem List   Diagnosis Date Noted   Basal cell carcinoma of nose 06/22/2018   Low back pain 05/30/2016   Preventative health care 12/22/2015   Sleep disturbance 12/22/2015   Skin lesion 12/22/2015   Allergic asthma    Headache 09/08/2015   Chronic fatigue 09/08/2015   Obesity 12/02/2014   Hypokalemia 12/13/2013   Family history of ovarian cancer 11/22/2013   Gastroesophageal reflux disease without esophagitis 11/22/2013   Essential hypertension 11/22/2013   Rhinitis, allergic 11/22/2013    Past Surgical History:  Procedure Laterality Date   CATARACT EXTRACTION W/ INTRAOCULAR LENS  IMPLANT, BILATERAL  2007/2018  Bil   COLONOSCOPY     COSMETIC SURGERY     Patient reports plastic surgery to neck, eyes, nose in the past.    LIPOSUCTION  2001   POLYPECTOMY     ROOT CANAL     TONSILLECTOMY  1978   TOOTH EXTRACTION      Current Outpatient Medications  Medication Sig Dispense Refill   albuterol (PROVENTIL HFA;VENTOLIN HFA) 108 (90 BASE) MCG/ACT inhaler Inhale 1-2 puffs into the lungs every 6 (six) hours as needed for wheezing or shortness of breath. 1 Inhaler 2   albuterol (VENTOLIN HFA) 108 (90  Base) MCG/ACT inhaler Inhale 2 puffs into the lungs every 6 (six) hours as needed for wheezing or shortness of breath. 8 g 0   atenolol (TENORMIN) 25 MG tablet TAKE 1 TABLET (25 MG TOTAL) BY MOUTH DAILY. 90 tablet 2   Biotin 1000 MCG tablet Take 1,000 mcg by mouth. Occasionally     Cholecalciferol (VITAMIN D3) 50 MCG (2000 UT) TABS 1 tablet     Cyanocobalamin (B-12) 100 MCG TABS Take 1 tablet by mouth. Takes sometimes     Docusate Sodium (EQ STOOL SOFTENER PO) Take by mouth.     fluticasone (FLONASE) 50 MCG/ACT nasal spray Place 2 sprays into both nostrils daily as needed for allergies or rhinitis. 16 g 5   fluticasone (FLONASE) 50 MCG/ACT nasal spray Place 2 sprays into both nostrils daily. (Patient taking differently: Place 2 sprays into both nostrils daily. As needed) 16 g 6   hydroxypropyl methylcellulose / hypromellose (ISOPTO TEARS / GONIOVISC) 2.5 % ophthalmic solution 1 drop.     Inulin (FIBER CHOICE PO) Take by mouth.     Magnesium 500 MG TABS Takes when remembers to     Sennosides (SENOKOT PO) Take by mouth daily.     valACYclovir (VALTREX) 500 MG tablet TAKE 1 TABLET BY MOUTH TWICE A DAY (Patient taking differently: When needed) 30 tablet 2   cyclobenzaprine (FLEXERIL) 10 MG tablet Take 0.5-1 tablets (5-10 mg total) by mouth 2 (two) times daily as needed for muscle spasms. 60 tablet 4   No current facility-administered medications for this visit.    Allergies as of 10/14/2022 - Review Complete 10/14/2022  Allergen Reaction Noted   Other Diarrhea and Nausea And Vomiting 11/20/2013   Mercury  10/19/2020   Codeine Nausea And Vomiting 11/20/2013   Hydrocodone Other (See Comments) 05/30/2016    Vitals: BP (!) 148/84 (BP Location: Right Arm, Patient Position: Sitting)   Pulse 75   Ht 5\' 3"  (1.6 m)   Wt 209 lb (94.8 kg)   BMI 37.02 kg/m  Last Weight:  Wt Readings from Last 1 Encounters:  10/14/22 209 lb (94.8 kg)   Last Height:   Ht Readings from Last 1 Encounters:   10/14/22 5\' 3"  (1.6 m)     Physical exam: Exam: Gen: NAD, conversant, well nourised, obese, well groomed                     CV: RRR, no MRG. No Carotid Bruits. No peripheral edema, warm, nontender Eyes: Conjunctivae clear without exudates or hemorrhage Very tight cervical muscles.   Neuro: Detailed Neurologic Exam  Speech:    Speech is normal; fluent and spontaneous with normal comprehension.  Cognition:    The patient is oriented to person, place, and time;     recent and remote memory intact;     language fluent;     normal attention, concentration,  fund of knowledge Cranial Nerves:    The pupils are equal, round, and reactive to light. Attempted, pupils too small to visualize fundi. Visual fields are full to finger confrontation. Extraocular movements are intact. Trigeminal sensation is intact and the muscles of mastication are normal. The face is symmetric. The palate elevates in the midline. Hearing intact. Voice is normal. Shoulder shrug is normal. The tongue has normal motion without fasciculations.   Coordination: nml  Gait: Uses a cane, antalgic  Motor Observation:    No asymmetry, no atrophy, and no involuntary movements noted. Tone:    Normal muscle tone.    Posture:    Posture is normal. normal erect    Strength: slight weakness left leg flexion and hip flexion otherwise    Strength is V/V in the upper and lower limbs.      Sensation: intact to LT     Reflex Exam:  DTR's:    Deep tendon reflexes in the upper and lower extremities are symmetrical and normal bilaterally even Ajs.   Toes:    The toes are downgoing bilaterally.   Clonus:    Clonus is absent.    Assessment/Plan:  72 year old female with chronis cervicalgia, occipital neuralgia and neck pain with movement. Neuro exam shows pain is bilateral and is located in the distribution of the greater, lesser and/or third occipital nerves, paroxysmal and brief, painful, sharp, with tenderness  and trigger points at the emergence of the greater occipital nerve. Differential includes pain in the upper cervical joints or disks, suboccipital or upper posterior neck muscles including the traps/scm, spinal and posterior cranial fossa dura mater, vertebral arteries, structural and infiltrative lesions such as meningioma, schwannoma, myelitis, compressive disk disease and others. Will need MRI of the brain and cervical spine to evaluate for surgical or interventional options (ESI) , mri brain for stroke causing weakness or other etiology.   - MRi brain w/wo contrast - MRI cervical spine -Lidocaine patches or salonpas and topical treatments(arnicare or voltaren otc but didn;t work for her may try again) -Heating pad - muscle relaxer - refill flexeril -More invasive options: Epidural Steroid Injections or medial branch blocks for neck pain and occipital neuralgia in the future as needed. - Gabapentin if needed, may be willing to try in the future - Physical Therapy: Cervical myofascial pain, occipital neuralgia, cervical myofascial syndrome, cervicalgia. Please evaluate and treat including dry needling, stretching, strengthening, manual therapy/massage, heating, TENS unit, exercising for scapular stabilization, pectoral stretching and rhomboid strengthening as clinically warranted as well as any other modality as recommended by evaluation (acupuncture helped) - Flexeril at bedtime, discussed risks and sedation -More invasive options: Epidural Steroid Injections or medial branch blocks for neck pain a Gamma knife or radiofequency Ablation  -Differential: Occipital neuralgia, or arthritis in the neck or muscle spasms in the neck on the causing cervicalgia  Discussed the following      Occipital neuralgia  Occipital Neuralgia  Occipital neuralgia is a type of headache that causes brief episodes of very bad pain in the back of the head. Pain from occipital neuralgia may spread (radiate) to other  parts of the head. These headaches may be caused by irritation of the nerves that leave the spinal cord high up in the neck, just below the base of the skull (occipital nerves). The occipital nerves transmit sensations from the back of the head, the top of the head, and the areas behind the ears. What are the causes? This condition can occur without  any known cause (primary headache syndrome). In other cases, this condition is caused by pressure on or irritation of one of the two occipital nerves. Pressure and irritation may be due to: Muscle spasm in the neck. Neck injury. Wear and tear of the vertebrae in the neck (osteoarthritis). Disease of the disks that separate the vertebrae. Swollen blood vessels that put pressure on the occipital nerves. Infections. Tumors. Diabetes. What are the signs or symptoms? This condition causes brief burning, stabbing, electric, shocking, or shooting pain in the back of the head that can radiate to the top of the head. It can happen on one side or both sides of the head. It can also cause: Pain behind the eye. Pain triggered by neck movement or hair brushing. Scalp tenderness. Aching in the back of the head between episodes of very bad pain. Pain that gets worse with exposure to bright lights. How is this diagnosed? Your health care provider may diagnose the condition based on a physical exam and your symptoms. Tests may be done, such as: Imaging studies of the brain and neck (cervical spine), such as an MRI or CT scan. These look for causes of pinched nerves. Applying pressure to the nerves in the neck to try to re-create the pain. Injection of numbing medicine into the occipital nerve areas to see if pain goes away (diagnostic nerve block). How is this treated? Treatment for this condition may begin with simple measures, such as: Rest. Massage. Applying heat or cold to the area. Over-the-counter pain relievers. If these measures do not work, you may  need other treatments, including: Medicines, such as: Prescription-strength anti-inflammatory medicines. Muscle relaxants. Anti-seizure medicines, which can relieve pain. Antidepressants, which can relieve pain. Injected medicines, such as medicines that numb the area (local anesthetic) and steroids. Pulsed radiofrequency ablation. This is when wires are implanted to deliver electrical impulses that block pain signals from the occipital nerve. Surgery to relieve nerve pressure. Physical therapy. Follow these instructions at home: Managing pain     Avoid any activities that cause pain. Rest when you have an attack of pain. Try gentle massage to relieve pain. Try a different pillow or sleeping position. If directed, apply heat to the affected area as often as told by your health care provider. Use the heat source that your health care provider recommends, such as a moist heat pack or a heating pad. Place a towel between your skin and the heat source. Leave the heat on for 20-30 minutes. Remove the heat if your skin turns bright red. This is especially important if you are unable to feel pain, heat, or cold. You have a greater risk of getting burned. If directed, put ice on the back of your head and neck area. To do this: Put ice in a plastic bag. Place a towel between your skin and the bag. Leave the ice on for 20 minutes, 2-3 times a day. Remove the ice if your skin turns bright red. This is very important. If you cannot feel pain, heat, or cold, you have a greater risk of damage to the area. General instructions Take over-the-counter and prescription medicines only as told by your health care provider. Avoid things that make your symptoms worse, such as bright lights. Try to stay active. Get regular exercise that does not cause pain. Ask your health care provider to suggest safe exercises for you. Work with a physical therapist to learn stretching exercises you can do at  home. Practice good posture. Keep all  follow-up visits. This is important. Contact a health care provider if: Your medicine is not working. You have new or worsening symptoms. Get help right away if: You have very bad head pain that does not go away. You have a sudden change in vision, balance, or speech. These symptoms may represent a serious problem that is an emergency. Do not wait to see if the symptoms will go away. Get medical help right away. Call your local emergency services (911 in the [place] Do not drive yourself to the hospital. Summary Occipital neuralgia is a type of headache that causes brief episodes of very bad pain in the back of the head. Pain from occipital neuralgia may spread (radiate) to other parts of the head. Treatment for this condition includes rest, massage, and medicines. This information is not intended to replace advice given to you by your health care provider. Make sure you discuss any questions you have with your health care provider. Document Revised: 11/17/2019 Document Reviewed: 11/17/2019 Elsevier Patient Education  2023 Elsevier Inc.  Gabapentin Capsules or Tablets What is this medication? GABAPENTIN (GA ba pen tin) treats nerve pain. It may also be used to prevent and control seizures in people with epilepsy. It works by calming overactive nerves in your body. This medicine may be used for other purposes; ask your health care provider or pharmacist if you have questions. COMMON BRAND NAME(S): Active-PAC with Gabapentin, Ascencion Dike, Gralise, Neurontin What should I tell my care team before I take this medication? They need to know if you have any of these conditions: Alcohol or substance use disorder Kidney disease Lung or breathing disease Suicidal thoughts, plans, or attempt; a previous suicide attempt by you or a family member An unusual or allergic reaction to gabapentin, other medications, foods, dyes, or preservatives Pregnant or trying to  get pregnant Breast-feeding How should I use this medication? Take this medication by mouth with a glass of water. Follow the directions on the prescription label. You can take it with or without food. If it upsets your stomach, take it with food. Take your medication at regular intervals. Do not take it more often than directed. Do not stop taking except on your care team's advice. If you are directed to break the 600 or 800 mg tablets in half as part of your dose, the extra half tablet should be used for the next dose. If you have not used the extra half tablet within 28 days, it should be thrown away. A special MedGuide will be given to you by the pharmacist with each prescription and refill. Be sure to read this information carefully each time. Talk to your care team about the use of this medication in children. While this medication may be prescribed for children as young as 3 years for selected conditions, precautions do apply. Overdosage: If you think you have taken too much of this medicine contact a poison control center or emergency room at once. NOTE: This medicine is only for you. Do not share this medicine with others. What if I miss a dose? If you miss a dose, take it as soon as you can. If it is almost time for your next dose, take only that dose. Do not take double or extra doses. What may interact with this medication? Alcohol Antihistamines for allergy, cough, and cold Certain medications for anxiety or sleep Certain medications for depression like amitriptyline, fluoxetine, sertraline Certain medications for seizures like phenobarbital, primidone Certain medications for stomach problems General anesthetics like halothane,  isoflurane, methoxyflurane, propofol Local anesthetics like lidocaine, pramoxine, tetracaine Medications that relax muscles for surgery Opioid medications for pain Phenothiazines like chlorpromazine, mesoridazine, prochlorperazine, thioridazine This list  may not describe all possible interactions. Give your health care provider a list of all the medicines, herbs, non-prescription drugs, or dietary supplements you use. Also tell them if you smoke, drink alcohol, or use illegal drugs. Some items may interact with your medicine. What should I watch for while using this medication? Visit your care team for regular checks on your progress. You may want to keep a record at home of how you feel your condition is responding to treatment. You may want to share this information with your care team at each visit. You should contact your care team if your seizures get worse or if you have any new types of seizures. Do not stop taking this medication or any of your seizure medications unless instructed by your care team. Stopping your medication suddenly can increase your seizures or their severity. This medication may cause serious skin reactions. They can happen weeks to months after starting the medication. Contact your care team right away if you notice fevers or flu-like symptoms with a rash. The rash may be red or purple and then turn into blisters or peeling of the skin. Or, you might notice a red rash with swelling of the face, lips or lymph nodes in your neck or under your arms. Wear a medical identification bracelet or chain if you are taking this medication for seizures. Carry a card that lists all your medications. This medication may affect your coordination, reaction time, or judgment. Do not drive or operate machinery until you know how this medication affects you. Sit up or stand slowly to reduce the risk of dizzy or fainting spells. Drinking alcohol with this medication can increase the risk of these side effects. Your mouth may get dry. Chewing sugarless gum or sucking hard candy, and drinking plenty of water may help. Watch for new or worsening thoughts of suicide or depression. This includes sudden changes in mood, behaviors, or thoughts. These changes  can happen at any time but are more common in the beginning of treatment or after a change in dose. Call your care team right away if you experience these thoughts or worsening depression. If you become pregnant while using this medication, you may enroll in the [place] [employer] Antiepileptic Drug Pregnancy Registry by calling [id number]. This registry collects information about the safety of antiepileptic medication use during pregnancy. What side effects may I notice from receiving this medication? Side effects that you should report to your care team as soon as possible: Allergic reactions or angioedema--skin rash, itching, hives, swelling of the face, eyes, lips, tongue, arms, or legs, trouble swallowing or breathing Rash, fever, and swollen lymph nodes Thoughts of suicide or self harm, worsening mood, feelings of depression Trouble breathing Unusual changes in mood or behavior in children after use such as difficulty concentrating, hostility, or restlessness Side effects that usually do not require medical attention (report to your care team if they continue or are bothersome): Dizziness Drowsiness Nausea Swelling of ankles, feet, or hands Vomiting This list may not describe all possible side effects. Call your doctor for medical advice about side effects. You may report side effects to FDA at 1-800-FDA-1088. Where should I keep my medication? Keep out of reach of children and pets. Store at room temperature between 15 and 30 degrees C (59 and 86 degrees F). Get rid of  any unused medication after the expiration date. This medication may cause accidental overdose and death if taken by other adults, children, or pets. To get rid of medications that are no longer needed or have expired: Take the medication to a medication take-back program. Check with your pharmacy or law enforcement to find a location. If you cannot return the medication, check the label or package insert to see if the  medication should be thrown out in the garbage or flushed down the toilet. If you are not sure, ask your care team. If it is safe to put it in the trash, empty the medication out of the container. Mix the medication with cat litter, dirt, coffee grounds, or other unwanted substance. Seal the mixture in a bag or container. Put it in the trash. NOTE: This sheet is a summary. It may not cover all possible information. If you have questions about this medicine, talk to your doctor, pharmacist, or health care provider.  2023 Elsevier/Gold Standard (2020-07-20 00:00:00) Methylprednisolone Tablets What is this medication? METHYLPREDNISOLONE (meth ill pred NISS oh lone) treats many conditions such as asthma, allergic reactions, arthritis, inflammatory bowel diseases, adrenal, and blood or bone marrow disorders. It works by decreasing inflammation, slowing down an overactive immune system, or replacing cortisol normally made in the body. Cortisol is a hormone that plays an important role in how the body responds to stress, illness, and injury. It belongs to a group of medications called steroids. This medicine may be used for other purposes; ask your health care provider or pharmacist if you have questions. COMMON BRAND NAME(S): Medrol, Medrol Dosepak What should I tell my care team before I take this medication? They need to know if you have any of these conditions: Cushing's syndrome Eye disease, vision problems Diabetes Glaucoma Heart disease High blood pressure Infection especially a viral infection, such as chickenpox, cold sores, or herpes Liver disease Mental health conditions Myasthenia gravis Osteoporosis Recent or upcoming vaccine Seizures Stomach or intestine problems Thyroid disease An unusual or allergic reaction to lactose, methylprednisolone, other medications, foods, dyes, or preservatives Pregnant or trying to get pregnant Breastfeeding How should I use this medication? Take  this medication by mouth with a glass of water. Follow the directions on the prescription label. Take this medication with food. If you are taking this medication once a day, take it in the morning. Do not take it more often than directed. Do not suddenly stop taking your medication because you may develop a severe reaction. Your care team will tell you how much medication to take. If your care team wants you to stop the medication, the dose may be slowly lowered over time to avoid any side effects. Talk to your care team about the use of this medication in children. Special care may be needed. Overdosage: If you think you have taken too much of this medicine contact a poison control center or emergency room at once. NOTE: This medicine is only for you. Do not share this medicine with others. What if I miss a dose? If you miss a dose, take it as soon as you can. If it is almost time for your next dose, talk to your care team. You may need to miss a dose or take an extra dose. Do not take double or extra doses without advice. What may interact with this medication? Do not take this medication with any of the following: Alefacept Echinacea Live virus vaccines Metyrapone Mifepristone This medication may also interact with the  following: Amphotericin B Aspirin and aspirin-like medications Certain antibiotics, such as erythromycin, clarithromycin, troleandomycin Certain medications for diabetes Certain medications for fungal infections, such as ketoconazole Certain medications for seizures, such as carbamazepine, phenobarbital, phenytoin Certain medications that treat or prevent blood clots, such as warfarin Cholestyramine Cyclosporine Digoxin Diuretics Estrogen or progestin hormones Isoniazid NSAIDs, medications for pain and inflammation, such as ibuprofen or naproxen Other medications for myasthenia gravis Rifampin Vaccines This list may not describe all possible interactions. Give your  health care provider a list of all the medicines, herbs, non-prescription drugs, or dietary supplements you use. Also tell them if you smoke, drink alcohol, or use illegal drugs. Some items may interact with your medicine. What should I watch for while using this medication? Tell your care team if your symptoms do not start to get better or if they get worse. Do not stop taking except on your care team's advice. You may develop a severe reaction. Your care team will tell you how much medication to take. This medication may increase your risk of getting an infection. Tell your care team if you are around anyone with measles or chickenpox, or if you develop sores or blisters that do not heal properly. This medication may increase blood sugar levels. Ask your care team if changes in diet or medications are needed if you have diabetes. Tell your care team right away if you have any change in your eyesight. Using this medication for a long time may increase your risk of low bone mass. Talk to your care team about bone health. What side effects may I notice from receiving this medication? Side effects that you should report to your care team as soon as possible: Allergic reactions--skin rash, itching, hives, swelling of the face, lips, tongue, or throat Cushing syndrome--increased fat around the midsection, upper back, neck, or face, pink or purple stretch marks on the skin, thinning, fragile skin that easily bruises, unexpected hair growth High blood sugar (hyperglycemia)--increased thirst or amount of urine, unusual weakness or fatigue, blurry vision Increase in blood pressure Infection--fever, chills, cough, sore throat, wounds that don't heal, pain or trouble when passing urine, general feeling of discomfort or being unwell Low adrenal gland function--nausea, vomiting, loss of appetite, unusual weakness or fatigue, dizziness Mood and behavior changes--anxiety, nervousness, confusion, hallucinations,  irritability, hostility, thoughts of suicide or self-harm, worsening mood, feelings of depression Stomach bleeding--bloody or black, tar-like stools, vomiting blood or brown material that looks like coffee grounds Swelling of the ankles, hands, or feet Side effects that usually do not require medical attention (report to your care team if they continue or are bothersome): Acne General discomfort and fatigue Headache Increase in appetite Nausea Trouble sleeping Weight gain This list may not describe all possible side effects. Call your doctor for medical advice about side effects. You may report side effects to FDA at 1-800-FDA-1088. Where should I keep my medication? Keep out of the reach of children and pets. Store at room temperature between 20 and 25 degrees C (68 and 77 degrees F). Throw away any unused medication after the expiration date. NOTE: This sheet is a summary. It may not cover all possible information. If you have questions about this medicine, talk to your doctor, pharmacist, or health care provider.  2023 Elsevier/Gold Standard (2020-04-14 00:00:00) Lidocaine Patches What is this medication? LIDOCAINE (LYE doe kane) treats pain. It works by numbing a specific area of the body, which blocks pain signals going to the brain. It belongs to a  group of medications called local anesthetics. This medicine may be used for other purposes; ask your health care provider or pharmacist if you have questions. COMMON BRAND NAME(S): Aspercreme with Lidocaine, Blue-Emu, DERMALID, GEN7T, Lidocan, Lidocare, Lidoderm, Lidofore, LidoReal-30, Lidozo, Salonpas Lidocaine, Xyliderm, ZTlido What should I tell my care team before I take this medication? They need to know if you have any of these conditions: G6PD deficiency Heart disease Kidney disease Liver disease Skin conditions or sensitivity An unusual or allergic reaction to lidocaine, parabens, other medications, foods, dyes, or  preservatives Pregnant or trying to get pregnant Breast-feeding How should I use this medication? This medication is for external use only. Use it as directed on the label. Do not apply to burned or damaged skin. Do not use it more often than directed. Keep using it unless your care team tells you to stop. Talk to your care team about the use of this medication in children. Special care may be needed. Overdosage: If you think you have taken too much of this medicine contact a poison control center or emergency room at once. NOTE: This medicine is only for you. Do not share this medicine with others. What if I miss a dose? If you miss a dose, use it as soon as you can. If it is almost time for your next dose, use only that dose. Do not use double or extra doses. What may interact with this medication? This medication may interact with the following: Acetaminophen Certain antibiotics, such as dapsone, nitrofurantoin, aminosalicylic acid, sulfasalazine Certain medications for seizures, such as phenobarbital, phenytoin, valproic acid Chloroquine Cyclophosphamide Dofetilide Flutamide Hydroxyurea Ifosfamide Local anesthetics, such as lidocaine, pramoxine, tetracaine MAOIs, such as Carbex, Eldepryl, Marplan, Nardil, and Parnate Metoclopramide Moricizine Nitroglycerin Primaquine Saquinavir Quinine This list may not describe all possible interactions. Give your health care provider a list of all the medicines, herbs, non-prescription drugs, or dietary supplements you use. Also tell them if you smoke, drink alcohol, or use illegal drugs. Some items may interact with your medicine. What should I watch for while using this medication? Visit your care team for regular checks on your progress. Tell your care team if your symptoms do not start to get better or if they get worse. Be careful to avoid injury while the area is numb, and you are not aware of pain. If you are going to need surgery, an  MRI, CT scan, or other procedure, tell your care team that you are using this medication. You may need to remove the patch before the procedure. What side effects may I notice from receiving this medication? Side effects that you should report to your care team as soon as possible: Allergic reactions--skin rash, itching, hives, swelling of the face, lips, tongue, or throat Headache, unusual weakness or fatigue, shortness of breath, nausea, vomiting, rapid heartbeat, blue skin or lips, which may be signs of methemoglobinemia Heart rhythm changes--fast or irregular heartbeat, dizziness, feeling faint or lightheaded, chest pain, trouble breathing Side effects that usually do not require medical attention (report to your care team if they continue or are bothersome): Change in skin color Irritation at application site This list may not describe all possible side effects. Call your doctor for medical advice about side effects. You may report side effects to FDA at 1-800-FDA-1088. Where should I keep my medication? Keep out of the reach of children and pets. See product for storage information. Each product may have different instructions. Get rid of any unused medication after the expiration  date. To get rid of medications that are no longer needed or have expired: Take the medication to a medication take-back program. Check with your pharmacy or law enforcement to find a location. If you cannot return the medication, ask your pharmacist or care team how to get rid of this medication safely. NOTE: This sheet is a summary. It may not cover all possible information. If you have questions about this medicine, talk to your doctor, pharmacist, or health care provider.  2023 Elsevier/Gold Standard (2021-02-05 00:00:00)      Orders Placed This Encounter  Procedures   MR CERVICAL SPINE WO CONTRAST   MR BRAIN W WO CONTRAST   Ambulatory referral to Physical Therapy   Meds ordered this encounter   Medications   cyclobenzaprine (FLEXERIL) 10 MG tablet    Sig: Take 0.5-1 tablets (5-10 mg total) by mouth 2 (two) times daily as needed for muscle spasms.    Dispense:  60 tablet    Refill:  4    Cc: Newman Pies, MD,  Tis[place]c, Adelfa Koh, MD  Naomie Dean, MD  [facility] [address] [place], [place] [address]  Phone [phone] Fax [phone]

## 2022-10-14 NOTE — Patient Instructions (Addendum)
- MRi brain w/wo contrast - MRI cervical spine -Lidocaine patches or salonpas and topical treatments(arnicare or voltaren otc but didn;t work for her may try again) -Heating pad - muscle relaxer - refill flexeril -More invasive options: Epidural Steroid Injections or medial branch blocks for neck pain and occipital neuralgia in the future as needed. - Gabapentin if needed, may be willing to try in the future - Physical Therapy: Cervical myofascial pain, occipital neuralgia, cervical myofascial syndrome, cervicalgia. Please evaluate and treat including dry needling, stretching, strengthening, manual therapy/massage, heating, TENS unit, exercising for scapular stabilization, pectoral stretching and rhomboid strengthening as clinically warranted as well as any other modality as recommended by evaluation (acupuncture helped) - Flexeril at bedtime, discussed risks and sedation -More invasive options: Epidural Steroid Injections or medial branch blocks for neck pain a Gamma knife or radiofequency Ablation  -Differential: Occipital neuralgia, or arthritis in the neck or muscle spasms in the neck on the causing cervicalgia          Occipital Neuralgia  Occipital neuralgia is a type of headache that causes brief episodes of very bad pain in the back of the head. Pain from occipital neuralgia may spread (radiate) to other parts of the head. These headaches may be caused by irritation of the nerves that leave the spinal cord high up in the neck, just below the base of the skull (occipital nerves). The occipital nerves transmit sensations from the back of the head, the top of the head, and the areas behind the ears. What are the causes? This condition can occur without any known cause (primary headache syndrome). In other cases, this condition is caused by pressure on or irritation of one of the two occipital nerves. Pressure and irritation may be due to: Muscle spasm in the neck. Neck injury. Wear  and tear of the vertebrae in the neck (osteoarthritis). Disease of the disks that separate the vertebrae. Swollen blood vessels that put pressure on the occipital nerves. Infections. Tumors. Diabetes. What are the signs or symptoms? This condition causes brief burning, stabbing, electric, shocking, or shooting pain in the back of the head that can radiate to the top of the head. It can happen on one side or both sides of the head. It can also cause: Pain behind the eye. Pain triggered by neck movement or hair brushing. Scalp tenderness. Aching in the back of the head between episodes of very bad pain. Pain that gets worse with exposure to bright lights. How is this diagnosed? Your health care provider may diagnose the condition based on a physical exam and your symptoms. Tests may be done, such as: Imaging studies of the brain and neck (cervical spine), such as an MRI or CT scan. These look for causes of pinched nerves. Applying pressure to the nerves in the neck to try to re-create the pain. Injection of numbing medicine into the occipital nerve areas to see if pain goes away (diagnostic nerve block). How is this treated? Treatment for this condition may begin with simple measures, such as: Rest. Massage. Applying heat or cold to the area. Over-the-counter pain relievers. If these measures do not work, you may need other treatments, including: Medicines, such as: Prescription-strength anti-inflammatory medicines. Muscle relaxants. Anti-seizure medicines, which can relieve pain. Antidepressants, which can relieve pain. Injected medicines, such as medicines that numb the area (local anesthetic) and steroids. Pulsed radiofrequency ablation. This is when wires are implanted to deliver electrical impulses that block pain signals from the occipital nerve. Surgery to  relieve nerve pressure. Physical therapy. Follow these instructions at home: Managing pain     Avoid any activities  that cause pain. Rest when you have an attack of pain. Try gentle massage to relieve pain. Try a different pillow or sleeping position. If directed, apply heat to the affected area as often as told by your health care provider. Use the heat source that your health care provider recommends, such as a moist heat pack or a heating pad. Place a towel between your skin and the heat source. Leave the heat on for 20-30 minutes. Remove the heat if your skin turns bright red. This is especially important if you are unable to feel pain, heat, or cold. You have a greater risk of getting burned. If directed, put ice on the back of your head and neck area. To do this: Put ice in a plastic bag. Place a towel between your skin and the bag. Leave the ice on for 20 minutes, 2-3 times a day. Remove the ice if your skin turns bright red. This is very important. If you cannot feel pain, heat, or cold, you have a greater risk of damage to the area. General instructions Take over-the-counter and prescription medicines only as told by your health care provider. Avoid things that make your symptoms worse, such as bright lights. Try to stay active. Get regular exercise that does not cause pain. Ask your health care provider to suggest safe exercises for you. Work with a physical therapist to learn stretching exercises you can do at home. Practice good posture. Keep all follow-up visits. This is important. Contact a health care provider if: Your medicine is not working. You have new or worsening symptoms. Get help right away if: You have very bad head pain that does not go away. You have a sudden change in vision, balance, or speech. These symptoms may represent a serious problem that is an emergency. Do not wait to see if the symptoms will go away. Get medical help right away. Call your local emergency services (911 in the [place] Do not drive yourself to the hospital. Summary Occipital neuralgia is a type of  headache that causes brief episodes of very bad pain in the back of the head. Pain from occipital neuralgia may spread (radiate) to other parts of the head. Treatment for this condition includes rest, massage, and medicines. This information is not intended to replace advice given to you by your health care provider. Make sure you discuss any questions you have with your health care provider. Document Revised: 11/17/2019 Document Reviewed: 11/17/2019 Elsevier Patient Education  2023 [employer]  Gabapentin Capsules or Tablets What is this medication? GABAPENTIN (GA ba pen tin) treats nerve pain. It may also be used to prevent and control seizures in people with epilepsy. It works by calming overactive nerves in your body. This medicine may be used for other purposes; ask your health care provider or pharmacist if you have questions. COMMON BRAND NAME(S): Active-PAC with Gabapentin, Ascencion Dike, Gralise, Neurontin What should I tell my care team before I take this medication? They need to know if you have any of these conditions: Alcohol or substance use disorder Kidney disease Lung or breathing disease Suicidal thoughts, plans, or attempt; a previous suicide attempt by you or a family member An unusual or allergic reaction to gabapentin, other medications, foods, dyes, or preservatives Pregnant or trying to get pregnant Breast-feeding How should I use this medication? Take this medication by mouth with a glass of  water. Follow the directions on the prescription label. You can take it with or without food. If it upsets your stomach, take it with food. Take your medication at regular intervals. Do not take it more often than directed. Do not stop taking except on your care team's advice. If you are directed to break the 600 or 800 mg tablets in half as part of your dose, the extra half tablet should be used for the next dose. If you have not used the extra half tablet within 28 days, it should  be thrown away. A special MedGuide will be given to you by the pharmacist with each prescription and refill. Be sure to read this information carefully each time. Talk to your care team about the use of this medication in children. While this medication may be prescribed for children as young as 3 years for selected conditions, precautions do apply. Overdosage: If you think you have taken too much of this medicine contact a poison control center or emergency room at once. NOTE: This medicine is only for you. Do not share this medicine with others. What if I miss a dose? If you miss a dose, take it as soon as you can. If it is almost time for your next dose, take only that dose. Do not take double or extra doses. What may interact with this medication? Alcohol Antihistamines for allergy, cough, and cold Certain medications for anxiety or sleep Certain medications for depression like amitriptyline, fluoxetine, sertraline Certain medications for seizures like phenobarbital, primidone Certain medications for stomach problems General anesthetics like halothane, isoflurane, methoxyflurane, propofol Local anesthetics like lidocaine, pramoxine, tetracaine Medications that relax muscles for surgery Opioid medications for pain Phenothiazines like chlorpromazine, mesoridazine, prochlorperazine, thioridazine This list may not describe all possible interactions. Give your health care provider a list of all the medicines, herbs, non-prescription drugs, or dietary supplements you use. Also tell them if you smoke, drink alcohol, or use illegal drugs. Some items may interact with your medicine. What should I watch for while using this medication? Visit your care team for regular checks on your progress. You may want to keep a record at home of how you feel your condition is responding to treatment. You may want to share this information with your care team at each visit. You should contact your care team if your  seizures get worse or if you have any new types of seizures. Do not stop taking this medication or any of your seizure medications unless instructed by your care team. Stopping your medication suddenly can increase your seizures or their severity. This medication may cause serious skin reactions. They can happen weeks to months after starting the medication. Contact your care team right away if you notice fevers or flu-like symptoms with a rash. The rash may be red or purple and then turn into blisters or peeling of the skin. Or, you might notice a red rash with swelling of the face, lips or lymph nodes in your neck or under your arms. Wear a medical identification bracelet or chain if you are taking this medication for seizures. Carry a card that lists all your medications. This medication may affect your coordination, reaction time, or judgment. Do not drive or operate machinery until you know how this medication affects you. Sit up or stand slowly to reduce the risk of dizzy or fainting spells. Drinking alcohol with this medication can increase the risk of these side effects. Your mouth may get dry. Chewing sugarless gum or  sucking hard candy, and drinking plenty of water may help. Watch for new or worsening thoughts of suicide or depression. This includes sudden changes in mood, behaviors, or thoughts. These changes can happen at any time but are more common in the beginning of treatment or after a change in dose. Call your care team right away if you experience these thoughts or worsening depression. If you become pregnant while using this medication, you may enroll in the [place] [employer] Antiepileptic Drug Pregnancy Registry by calling [phone]. This registry collects information about the safety of antiepileptic medication use during pregnancy. What side effects may I notice from receiving this medication? Side effects that you should report to your care team as soon as possible: Allergic  reactions or angioedema--skin rash, itching, hives, swelling of the face, eyes, lips, tongue, arms, or legs, trouble swallowing or breathing Rash, fever, and swollen lymph nodes Thoughts of suicide or self harm, worsening mood, feelings of depression Trouble breathing Unusual changes in mood or behavior in children after use such as difficulty concentrating, hostility, or restlessness Side effects that usually do not require medical attention (report to your care team if they continue or are bothersome): Dizziness Drowsiness Nausea Swelling of ankles, feet, or hands Vomiting This list may not describe all possible side effects. Call your doctor for medical advice about side effects. You may report side effects to FDA at 1-800-FDA-1088. Where should I keep my medication? Keep out of reach of children and pets. Store at room temperature between 15 and 30 degrees C (59 and 86 degrees F). Get rid of any unused medication after the expiration date. This medication may cause accidental overdose and death if taken by other adults, children, or pets. To get rid of medications that are no longer needed or have expired: Take the medication to a medication take-back program. Check with your pharmacy or law enforcement to find a location. If you cannot return the medication, check the label or package insert to see if the medication should be thrown out in the garbage or flushed down the toilet. If you are not sure, ask your care team. If it is safe to put it in the trash, empty the medication out of the container. Mix the medication with cat litter, dirt, coffee grounds, or other unwanted substance. Seal the mixture in a bag or container. Put it in the trash. NOTE: This sheet is a summary. It may not cover all possible information. If you have questions about this medicine, talk to your doctor, pharmacist, or health care provider.  2023 Elsevier/Gold Standard (2020-07-20 00:00:00) Methylprednisolone  Tablets What is this medication? METHYLPREDNISOLONE (meth ill pred NISS oh lone) treats many conditions such as asthma, allergic reactions, arthritis, inflammatory bowel diseases, adrenal, and blood or bone marrow disorders. It works by decreasing inflammation, slowing down an overactive immune system, or replacing cortisol normally made in the body. Cortisol is a hormone that plays an important role in how the body responds to stress, illness, and injury. It belongs to a group of medications called steroids. This medicine may be used for other purposes; ask your health care provider or pharmacist if you have questions. COMMON BRAND NAME(S): Medrol, Medrol Dosepak What should I tell my care team before I take this medication? They need to know if you have any of these conditions: Cushing's syndrome Eye disease, vision problems Diabetes Glaucoma Heart disease High blood pressure Infection especially a viral infection, such as chickenpox, cold sores, or herpes Liver disease Mental health conditions  Myasthenia gravis Osteoporosis Recent or upcoming vaccine Seizures Stomach or intestine problems Thyroid disease An unusual or allergic reaction to lactose, methylprednisolone, other medications, foods, dyes, or preservatives Pregnant or trying to get pregnant Breastfeeding How should I use this medication? Take this medication by mouth with a glass of water. Follow the directions on the prescription label. Take this medication with food. If you are taking this medication once a day, take it in the morning. Do not take it more often than directed. Do not suddenly stop taking your medication because you may develop a severe reaction. Your care team will tell you how much medication to take. If your care team wants you to stop the medication, the dose may be slowly lowered over time to avoid any side effects. Talk to your care team about the use of this medication in children. Special care may be  needed. Overdosage: If you think you have taken too much of this medicine contact a poison control center or emergency room at once. NOTE: This medicine is only for you. Do not share this medicine with others. What if I miss a dose? If you miss a dose, take it as soon as you can. If it is almost time for your next dose, talk to your care team. You may need to miss a dose or take an extra dose. Do not take double or extra doses without advice. What may interact with this medication? Do not take this medication with any of the following: Alefacept Echinacea Live virus vaccines Metyrapone Mifepristone This medication may also interact with the following: Amphotericin B Aspirin and aspirin-like medications Certain antibiotics, such as erythromycin, clarithromycin, troleandomycin Certain medications for diabetes Certain medications for fungal infections, such as ketoconazole Certain medications for seizures, such as carbamazepine, phenobarbital, phenytoin Certain medications that treat or prevent blood clots, such as warfarin Cholestyramine Cyclosporine Digoxin Diuretics Estrogen or progestin hormones Isoniazid NSAIDs, medications for pain and inflammation, such as ibuprofen or naproxen Other medications for myasthenia gravis Rifampin Vaccines This list may not describe all possible interactions. Give your health care provider a list of all the medicines, herbs, non-prescription drugs, or dietary supplements you use. Also tell them if you smoke, drink alcohol, or use illegal drugs. Some items may interact with your medicine. What should I watch for while using this medication? Tell your care team if your symptoms do not start to get better or if they get worse. Do not stop taking except on your care team's advice. You may develop a severe reaction. Your care team will tell you how much medication to take. This medication may increase your risk of getting an infection. Tell your care team  if you are around anyone with measles or chickenpox, or if you develop sores or blisters that do not heal properly. This medication may increase blood sugar levels. Ask your care team if changes in diet or medications are needed if you have diabetes. Tell your care team right away if you have any change in your eyesight. Using this medication for a long time may increase your risk of low bone mass. Talk to your care team about bone health. What side effects may I notice from receiving this medication? Side effects that you should report to your care team as soon as possible: Allergic reactions--skin rash, itching, hives, swelling of the face, lips, tongue, or throat Cushing syndrome--increased fat around the midsection, upper back, neck, or face, pink or purple stretch marks on the skin, thinning, fragile skin that  easily bruises, unexpected hair growth High blood sugar (hyperglycemia)--increased thirst or amount of urine, unusual weakness or fatigue, blurry vision Increase in blood pressure Infection--fever, chills, cough, sore throat, wounds that don't heal, pain or trouble when passing urine, general feeling of discomfort or being unwell Low adrenal gland function--nausea, vomiting, loss of appetite, unusual weakness or fatigue, dizziness Mood and behavior changes--anxiety, nervousness, confusion, hallucinations, irritability, hostility, thoughts of suicide or self-harm, worsening mood, feelings of depression Stomach bleeding--bloody or black, tar-like stools, vomiting blood or brown material that looks like coffee grounds Swelling of the ankles, hands, or feet Side effects that usually do not require medical attention (report to your care team if they continue or are bothersome): Acne General discomfort and fatigue Headache Increase in appetite Nausea Trouble sleeping Weight gain This list may not describe all possible side effects. Call your doctor for medical advice about side effects.  You may report side effects to FDA at 1-800-FDA-1088. Where should I keep my medication? Keep out of the reach of children and pets. Store at room temperature between 20 and 25 degrees C (68 and 77 degrees F). Throw away any unused medication after the expiration date. NOTE: This sheet is a summary. It may not cover all possible information. If you have questions about this medicine, talk to your doctor, pharmacist, or health care provider.  2023 Elsevier/Gold Standard (2020-04-14 00:00:00) Lidocaine Patches What is this medication? LIDOCAINE (LYE doe kane) treats pain. It works by numbing a specific area of the body, which blocks pain signals going to the brain. It belongs to a group of medications called local anesthetics. This medicine may be used for other purposes; ask your health care provider or pharmacist if you have questions. COMMON BRAND NAME(S): Aspercreme with Lidocaine, Blue-Emu, DERMALID, GEN7T, Lidocan, Lidocare, Lidoderm, Lidofore, LidoReal-30, Lidozo, Salonpas Lidocaine, Xyliderm, ZTlido What should I tell my care team before I take this medication? They need to know if you have any of these conditions: G6PD deficiency Heart disease Kidney disease Liver disease Skin conditions or sensitivity An unusual or allergic reaction to lidocaine, parabens, other medications, foods, dyes, or preservatives Pregnant or trying to get pregnant Breast-feeding How should I use this medication? This medication is for external use only. Use it as directed on the label. Do not apply to burned or damaged skin. Do not use it more often than directed. Keep using it unless your care team tells you to stop. Talk to your care team about the use of this medication in children. Special care may be needed. Overdosage: If you think you have taken too much of this medicine contact a poison control center or emergency room at once. NOTE: This medicine is only for you. Do not share this medicine with  others. What if I miss a dose? If you miss a dose, use it as soon as you can. If it is almost time for your next dose, use only that dose. Do not use double or extra doses. What may interact with this medication? This medication may interact with the following: Acetaminophen Certain antibiotics, such as dapsone, nitrofurantoin, aminosalicylic acid, sulfasalazine Certain medications for seizures, such as phenobarbital, phenytoin, valproic acid Chloroquine Cyclophosphamide Dofetilide Flutamide Hydroxyurea Ifosfamide Local anesthetics, such as lidocaine, pramoxine, tetracaine MAOIs, such as Carbex, Eldepryl, Marplan, Nardil, and Parnate Metoclopramide Moricizine Nitroglycerin Primaquine Saquinavir Quinine This list may not describe all possible interactions. Give your health care provider a list of all the medicines, herbs, non-prescription drugs, or dietary supplements you use. Also  tell them if you smoke, drink alcohol, or use illegal drugs. Some items may interact with your medicine. What should I watch for while using this medication? Visit your care team for regular checks on your progress. Tell your care team if your symptoms do not start to get better or if they get worse. Be careful to avoid injury while the area is numb, and you are not aware of pain. If you are going to need surgery, an MRI, CT scan, or other procedure, tell your care team that you are using this medication. You may need to remove the patch before the procedure. What side effects may I notice from receiving this medication? Side effects that you should report to your care team as soon as possible: Allergic reactions--skin rash, itching, hives, swelling of the face, lips, tongue, or throat Headache, unusual weakness or fatigue, shortness of breath, nausea, vomiting, rapid heartbeat, blue skin or lips, which may be signs of methemoglobinemia Heart rhythm changes--fast or irregular heartbeat, dizziness, feeling  faint or lightheaded, chest pain, trouble breathing Side effects that usually do not require medical attention (report to your care team if they continue or are bothersome): Change in skin color Irritation at application site This list may not describe all possible side effects. Call your doctor for medical advice about side effects. You may report side effects to FDA at 1-800-FDA-1088. Where should I keep my medication? Keep out of the reach of children and pets. See product for storage information. Each product may have different instructions. Get rid of any unused medication after the expiration date. To get rid of medications that are no longer needed or have expired: Take the medication to a medication take-back program. Check with your pharmacy or law enforcement to find a location. If you cannot return the medication, ask your pharmacist or care team how to get rid of this medication safely. NOTE: This sheet is a summary. It may not cover all possible information. If you have questions about this medicine, talk to your doctor, pharmacist, or health care provider.  2023 Elsevier/Gold Standard (2021-02-05 00:00:00)  Cyclobenzaprine Tablets What is this medication? CYCLOBENZAPRINE (sye kloe BEN za preen) treats muscle spasms. It works by relaxing your muscles, which reduces muscle stiffness. It belongs to a group of medications called muscle relaxants. This medicine may be used for other purposes; ask your health care provider or pharmacist if you have questions. COMMON BRAND NAME(S): Fexmid, Flexeril What should I tell my care team before I take this medication? They need to know if you have any of these conditions: Heart disease, irregular heartbeat, or previous heart attack Liver disease Thyroid problem An unusual or allergic reaction to cyclobenzaprine, tricyclic antidepressants, lactose, other medications, foods, dyes, or preservatives Pregnant or trying to get  pregnant Breastfeeding How should I use this medication? Take this medication by mouth with a glass of water. Take it as directed on the prescription label. You can take it with or without food. If it upsets your stomach, take it with food. Do not take it more often than directed. Talk to your care team about the use of this medication in children. Special care may be needed. Overdosage: If you think you have taken too much of this medicine contact a poison control center or emergency room at once. NOTE: This medicine is only for you. Do not share this medicine with others. What if I miss a dose? If you miss a dose, take it as soon as you can. If  it is almost time for your next dose, take only that dose. Do not take double or extra doses. What may interact with this medication? Do not take this medication with any of the following: MAOIs, such as Carbex, Eldepryl, Marplan, Nardil, and Parnate Opioid medications for cough Safinamide This medication may also interact with the following: Alcohol Bupropion Antihistamines for allergy, cough, and cold Certain medications for anxiety or sleep Certain medications for bladder problems, such as oxybutynin, tolterodine Certain medications for depression, such as amitriptyline, fluoxetine, sertraline Certain medications for Parkinson disease, such as benztropine, trihexyphenidyl Certain medications for seizures, such as phenobarbital, primidone Certain medications for stomach problems, such as dicyclomine, hyoscyamine Certain medications for travel sickness, such as scopolamine General anesthetics, such as halothane, isoflurane, methoxyflurane, propofol Ipratropium Local anesthetics, such as lidocaine, pramoxine, tetracaine Medications that relax muscles for surgery Opioid medications for pain Phenothiazines, such as chlorpromazine, mesoridazine, prochlorperazine, thioridazine Verapamil This list may not describe all possible interactions. Give  your health care provider a list of all the medicines, herbs, non-prescription drugs, or dietary supplements you use. Also tell them if you smoke, drink alcohol, or use illegal drugs. Some items may interact with your medicine. What should I watch for while using this medication? Visit your care team for regular checks on your progress. Tell your care team if your symptoms do not start to get better or if they get worse. This medication may affect your coordination, reaction time, or judgment. Do not drive or operate machinery until you know how this medication affects you. Sit up or stand slowly to reduce the risk of dizzy or fainting spells. Drinking alcohol with this medication can increase the risk of these side effects. Taking this medication with other substances that cause drowsiness, such as alcohol, benzodiazepines, or other opioids can cause serious side effects. Give your care team a list of all medications you use. They will tell you how much medication to take. Do not take more medication than directed. Call emergency services if you have problems breathing or staying awake. Your mouth may get dry. Chewing sugarless gum or sucking hard candy and drinking plenty of water may help. Contact your care team if the problem does not go away or is severe. What side effects may I notice from receiving this medication? Side effects that you should report to your care team as soon as possible: Allergic reactions--skin rash, itching, hives, swelling of the face, lips, tongue, or throat CNS depression--slow or shallow breathing, shortness of breath, feeling faint, dizziness, confusion, trouble staying awake Heart rhythm changes--fast or irregular heartbeat, dizziness, feeling faint or lightheaded, chest pain, trouble breathing Side effects that usually do not require medical attention (report to your care team if they continue or are bothersome): Constipation Dizziness Drowsiness Dry  mouth Fatigue Nausea This list may not describe all possible side effects. Call your doctor for medical advice about side effects. You may report side effects to FDA at 1-800-FDA-1088. Where should I keep my medication? Keep out of the reach of children and pets. Store at room temperature between 20 and 25 degrees C (68 and 77 degrees F). Keep container tightly closed. Get rid of any unused medication after the expiration date. To get rid of medications that are no longer needed or have expired: Take the medication to a medication take-back program. Check with your pharmacy or law enforcement to find a location. If you cannot return the medication, check the label or package insert to see if the medication should  be thrown out in the garbage or flushed down the toilet. If you are not sure, ask your care team. If it is safe to put it in the trash, empty the medication out of the container. Mix the medication with cat litter, dirt, coffee grounds, or other unwanted substance. Seal the mixture in a bag or container. Put it in the trash. NOTE: This sheet is a summary. It may not cover all possible information. If you have questions about this medicine, talk to your doctor, pharmacist, or health care provider.  2024 Elsevier/Gold Standard (2021-07-23 00:00:00)     Cc: Newman Pies, MD,  [facility], Adelfa Koh,

## 2022-10-19 ENCOUNTER — Telehealth: Payer: Self-pay | Admitting: Neurology

## 2022-10-19 NOTE — Telephone Encounter (Signed)
[employer]althte[employer] advantage NPR sent to [facility] [phone]

## 2022-10-28 DIAGNOSIS — R7301 Impaired fasting glucose: Secondary | ICD-10-CM | POA: Diagnosis not present

## 2022-10-28 DIAGNOSIS — Z1212 Encounter for screening for malignant neoplasm of rectum: Secondary | ICD-10-CM | POA: Diagnosis not present

## 2022-10-28 DIAGNOSIS — I10 Essential (primary) hypertension: Secondary | ICD-10-CM | POA: Diagnosis not present

## 2022-11-04 DIAGNOSIS — Z0000 Encounter for general adult medical examination without abnormal findings: Secondary | ICD-10-CM | POA: Diagnosis not present

## 2022-11-04 DIAGNOSIS — I10 Essential (primary) hypertension: Secondary | ICD-10-CM | POA: Diagnosis not present

## 2022-11-04 DIAGNOSIS — R82998 Other abnormal findings in urine: Secondary | ICD-10-CM | POA: Diagnosis not present

## 2022-11-04 DIAGNOSIS — H9313 Tinnitus, bilateral: Secondary | ICD-10-CM | POA: Diagnosis not present

## 2022-11-04 DIAGNOSIS — I700 Atherosclerosis of aorta: Secondary | ICD-10-CM | POA: Diagnosis not present

## 2022-11-04 DIAGNOSIS — Z1331 Encounter for screening for depression: Secondary | ICD-10-CM | POA: Diagnosis not present

## 2022-11-04 DIAGNOSIS — D692 Other nonthrombocytopenic purpura: Secondary | ICD-10-CM | POA: Diagnosis not present

## 2022-11-04 DIAGNOSIS — D126 Benign neoplasm of colon, unspecified: Secondary | ICD-10-CM | POA: Diagnosis not present

## 2022-11-04 DIAGNOSIS — Z23 Encounter for immunization: Secondary | ICD-10-CM | POA: Diagnosis not present

## 2022-11-04 DIAGNOSIS — J4530 Mild persistent asthma, uncomplicated: Secondary | ICD-10-CM | POA: Diagnosis not present

## 2022-11-04 DIAGNOSIS — K219 Gastro-esophageal reflux disease without esophagitis: Secondary | ICD-10-CM | POA: Diagnosis not present

## 2022-11-04 DIAGNOSIS — B009 Herpesviral infection, unspecified: Secondary | ICD-10-CM | POA: Diagnosis not present

## 2022-11-04 DIAGNOSIS — Z1339 Encounter for screening examination for other mental health and behavioral disorders: Secondary | ICD-10-CM | POA: Diagnosis not present

## 2022-11-04 DIAGNOSIS — R7301 Impaired fasting glucose: Secondary | ICD-10-CM | POA: Diagnosis not present

## 2022-11-04 DIAGNOSIS — M19072 Primary osteoarthritis, left ankle and foot: Secondary | ICD-10-CM | POA: Diagnosis not present

## 2022-11-26 ENCOUNTER — Ambulatory Visit
Admission: RE | Admit: 2022-11-26 | Discharge: 2022-11-26 | Disposition: A | Discharge status: Home / Self Care | Payer: PPO | Source: Ambulatory Visit | Attending: Neurology | Admitting: Neurology

## 2022-11-26 ENCOUNTER — Ambulatory Visit
Admission: RE | Admit: 2022-11-26 | Discharge: 2022-11-26 | Disposition: A | Discharge status: Home / Self Care | Payer: PPO | Source: Ambulatory Visit | Attending: Neurology

## 2022-11-26 DIAGNOSIS — R200 Anesthesia of skin: Secondary | ICD-10-CM

## 2022-11-26 DIAGNOSIS — R519 Headache, unspecified: Secondary | ICD-10-CM | POA: Diagnosis not present

## 2022-11-26 DIAGNOSIS — M542 Cervicalgia: Secondary | ICD-10-CM

## 2022-11-26 DIAGNOSIS — M5412 Radiculopathy, cervical region: Secondary | ICD-10-CM | POA: Diagnosis not present

## 2022-11-26 DIAGNOSIS — R29898 Other symptoms and signs involving the musculoskeletal system: Secondary | ICD-10-CM | POA: Diagnosis not present

## 2022-11-26 DIAGNOSIS — H5710 Ocular pain, unspecified eye: Secondary | ICD-10-CM

## 2022-11-26 MED ORDER — GADOPICLENOL 0.5 MMOL/ML IV SOLN
9.0000 mL | Freq: Once | INTRAVENOUS | Status: AC | PRN
  Administered 2022-11-26: 9 mL via INTRAVENOUS

## 2022-11-30 ENCOUNTER — Telehealth: Payer: Self-pay | Admitting: *Deleted

## 2022-11-30 NOTE — Telephone Encounter (Signed)
-----   Message from Asa Lente sent at 11/29/2022 12:56 PM EDT ----- Please let the patient know that: 1.  the MRI of the brain shows age-related changes but nothing unexpected. 2.  The MRI of the cervical spine shows that she has multilevel degenerative changes including spinal stenosis and possible nerve root compression.  This could cause neck, shoulder and arm pain.    Will recommend that we do gabapentin 300 mg 3 times daily.  If this does not help would be reasonable to get a neurosurgical opinion.

## 2022-11-30 NOTE — Telephone Encounter (Signed)
Called pt & LVM with office number asking for call back.  

## 2022-12-07 MED ORDER — GABAPENTIN 300 MG PO CAPS: 300.0000 mg | ORAL_CAPSULE | Freq: Three times a day (TID) | ORAL | 5 refills | Status: DC

## 2022-12-07 NOTE — Telephone Encounter (Signed)
Pt returned phone call  and would like a call back. 

## 2022-12-07 NOTE — Addendum Note (Signed)
Addended by: Guy Begin on: 12/07/2022 02:58 PM   Modules accepted: Orders

## 2022-12-07 NOTE — Telephone Encounter (Signed)
I called pt and relayed the results to pt. She verbalized understanding.  She did not have any questions.  Will try tihe gabapentin 300mg  po tid (side Effects drowsiness/ dizziness, read over the list given to at the pharmacy).  She always does.  Appreciated call back.  Will let us know how she does.  If referral to neuro surgery needed she will call back,

## 2022-12-08 ENCOUNTER — Other Ambulatory Visit: Payer: Self-pay | Admitting: Neurology

## 2022-12-08 ENCOUNTER — Telehealth: Payer: Self-pay | Admitting: *Deleted

## 2022-12-08 DIAGNOSIS — M4802 Spinal stenosis, cervical region: Secondary | ICD-10-CM

## 2022-12-08 NOTE — Telephone Encounter (Signed)
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 Nothing very concerning but I think you should at least have a consult with a neurosurgeon to review with you and see if there is anything surgical to do or if you want injections into the neck to help with pain. I will ask my nursing staff to call you to discuss and answer questions thank you, Dr. Lucia Gaskins

## 2022-12-08 NOTE — Telephone Encounter (Signed)
Noted  

## 2022-12-08 NOTE — Telephone Encounter (Signed)
Spoke to pt gave MRI Cervical spine results Pt wants to proceed with Neurosurgery . Pt states she will  start the Gabapentin today ordered by Dr Lucia Gaskins . Pt expressed understanding and thanked me for calling

## 2022-12-08 NOTE — Telephone Encounter (Signed)
Done. thanks

## 2022-12-12 ENCOUNTER — Telehealth: Payer: Self-pay | Admitting: Neurology

## 2022-12-12 NOTE — Telephone Encounter (Signed)
Referral for neurosurgery fax to [facility]. Phone: [phone], Fax: [phone] 

## 2023-01-17 DIAGNOSIS — Z85828 Personal history of other malignant neoplasm of skin: Secondary | ICD-10-CM | POA: Diagnosis not present

## 2023-01-17 DIAGNOSIS — L820 Inflamed seborrheic keratosis: Secondary | ICD-10-CM | POA: Diagnosis not present

## 2023-01-17 DIAGNOSIS — L821 Other seborrheic keratosis: Secondary | ICD-10-CM | POA: Diagnosis not present

## 2023-01-17 DIAGNOSIS — D692 Other nonthrombocytopenic purpura: Secondary | ICD-10-CM | POA: Diagnosis not present

## 2023-01-17 DIAGNOSIS — B360 Pityriasis versicolor: Secondary | ICD-10-CM | POA: Diagnosis not present

## 2023-01-17 DIAGNOSIS — D2372 Other benign neoplasm of skin of left lower limb, including hip: Secondary | ICD-10-CM | POA: Diagnosis not present

## 2023-01-17 DIAGNOSIS — D1724 Benign lipomatous neoplasm of skin and subcutaneous tissue of left leg: Secondary | ICD-10-CM | POA: Diagnosis not present

## 2023-01-30 DIAGNOSIS — Z6837 Body mass index (BMI) 37.0-37.9, adult: Secondary | ICD-10-CM | POA: Diagnosis not present

## 2023-01-30 DIAGNOSIS — G5603 Carpal tunnel syndrome, bilateral upper limbs: Secondary | ICD-10-CM | POA: Diagnosis not present

## 2023-01-30 DIAGNOSIS — M4802 Spinal stenosis, cervical region: Secondary | ICD-10-CM | POA: Diagnosis not present

## 2023-01-30 DIAGNOSIS — G959 Disease of spinal cord, unspecified: Secondary | ICD-10-CM | POA: Diagnosis not present

## 2023-05-10 ENCOUNTER — Ambulatory Visit: Payer: PPO | Admitting: Adult Health

## 2023-06-13 ENCOUNTER — Other Ambulatory Visit: Payer: Self-pay | Admitting: Neurology

## 2023-06-14 ENCOUNTER — Other Ambulatory Visit: Payer: Self-pay | Admitting: Neurology

## 2023-06-20 ENCOUNTER — Telehealth: Payer: Self-pay | Admitting: Neurology

## 2023-06-20 DIAGNOSIS — M4802 Spinal stenosis, cervical region: Secondary | ICD-10-CM

## 2023-06-20 MED ORDER — GABAPENTIN 300 MG PO CAPS: 300.0000 mg | ORAL_CAPSULE | Freq: Three times a day (TID) | ORAL | 0 refills | Status: AC

## 2023-06-20 NOTE — Telephone Encounter (Signed)
 I spoke with the patient. She states she has been stretching out her Gabapentin  because a refill was denied. She is down to the last 3 capsules. Patient states she saw Dr Julane Ny for a consult but his only recommendation was surgery which she does not want. Patient is still interested in injections however. Her symptoms are no better but Gabapentin  helps some. I told patient I would give her a refill and ask Dr Tresia Fruit for further recommendation. May end up canceling her appt with NP tomorrow. Pt was fine with canceling if we can refer elsewhere for intervention. She thanked me for the call.   [phone]

## 2023-06-20 NOTE — Telephone Encounter (Signed)
 Spoke with Dr Tresia Fruit who recommends a referral to Dr Crecencio Dodge.   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 Appt for tomorrow canceled. Referral placed to Dr Crecencio Dodge.

## 2023-06-20 NOTE — Addendum Note (Signed)
 Addended by: Burns Carwin on: 06/20/2023 05:45 PM   Modules accepted: Orders

## 2023-06-20 NOTE — Telephone Encounter (Signed)
 Pt called to schedule an appt for medication management because she is needing a refill on her Gabapentin . Pt states that it was to help with her neck pain she was experiencing. Pt states that she was referred out to a Neurosurgeon but when she went she stated that all they wanted to do was operate and with the possibility of more than one operation and she is "to old" for surgery. Please advise.

## 2023-06-20 NOTE — Progress Notes (Deleted)
 PATIENT: Briana Hull DOB: 01/31/1951  REASON FOR VISIT: follow up HISTORY FROM: patient PRIMARY NEUROLOGIST:   HISTORY OF PRESENT ILLNESS: Today   Briana Hull is a 73 y.o. female who has been followed in this office for ***. Returns today for follow-up.   HISTORY Briana Hull is a 73 y.o. female here as requested by Reynold Caves, MD for persistent face pain.  From Dr. Tio MD.  I reviewed his notes, a 73 year old female who he has seen several times, she was complaining of chronic nasal congestion, left periorbital pain, and right ear pressure and pain.  She was diagnosed with right ear eustachian tube dysfunction and referred right otalgia.  She was noted to have mucosal congestion nasal, septal deviation, and bilateral inferior turbinate hypertrophy.  She was treated with Flonase  and Valsalva exercise.  Her subsequent CT scan showed no significant acute or chronic sinusitis.  Patient returns today complaining of persistent face pain around her periorbital areas.  She has intermittent right ear pressure.  She denies any fever visual change.  Diagnosed with chronic rhinitis and nasal mucosal congestion, nasal septal deviation, bilateral inferior turbinate hypertrophy.  Persistent facial pain.  She has a history of skin cancer on her nose requiring radiation treatment in the past.  The nasal endoscopy findings and the CT images were reviewed with her, continue Flonase .   Patient is here alone for evaluation of persistent facial pain. She reports its actually headaches and she thinks it comes from her neck. She reports pain on the left side of head and forehead area. She was having very unusual severe pain about a week or so ago. She states it started in her ear and she was evaluated but didn't find anything. She has shoulder pain, neck pain. States she has problems with her back. She saw ENT and states they couldn't find anything w/ sinus so she was referred here.  She is having pain in  her neck, she can't even get comfortable lying down, had it for years and worsening. She has sciatica, they wanted to do surgery but she thought they should image her whole neuro axis and also that doctors only want to hear about one thing. Explained why this I the case and apoligized I understand I am a patient and a doctor too. Right ear but left side of her head. Headache every day. She has a stressful job. Right ear into her ear the pain was tremendous sharp, a few secs but repetitively then she couldn;t hear on the right. She is just now improving a pain every now and then when she got it unstopped (dxed with eustacian tube dysfuntion she went to a dentist and then an ENT and then back to a doctor). She has an appointment for blood work. Showed her occipital nerve have labs on 27th with [facility] can wait for those. She sees dr Kandyce Ort on 10/4. Starts in the neck on the left and shoots to her eye and across brow. She has multiple around the head and pain in the left eye and brow. And temple. Very tight muscles, muscles not happy, flexeril  helped. Brother died of a brain tumor. She isn;t hap[py with medical treatment. The physical therapists didn;t do anything for her.  She has numbness in the hands, she drops things, weakness, cannot walk well, gait abnormality, weakness.  REVIEW OF SYSTEMS: Out of a complete 14 system review of symptoms, the patient complains only of the following symptoms, and all other  reviewed systems are negative.  ALLERGIES: Allergies  Allergen Reactions   Other Diarrhea and Nausea And Vomiting    MSG   Mercury     Other reaction(s): if eats fish tastes metal    Codeine Nausea And Vomiting   Hydrocodone Other (See Comments)    Vomiting    HOME MEDICATIONS: Outpatient Medications Prior to Visit  Medication Sig Dispense Refill   albuterol  (PROVENTIL  HFA;VENTOLIN  HFA) 108 (90 BASE) MCG/ACT inhaler Inhale 1-2 puffs into the lungs every 6 (six) hours as needed for wheezing or  shortness of breath. 1 Inhaler 2   albuterol  (VENTOLIN  HFA) 108 (90 Base) MCG/ACT inhaler Inhale 2 puffs into the lungs every 6 (six) hours as needed for wheezing or shortness of breath. 8 g 0   atenolol  (TENORMIN ) 25 MG tablet TAKE 1 TABLET (25 MG TOTAL) BY MOUTH DAILY. 90 tablet 2   Biotin 1000 MCG tablet Take 1,000 mcg by mouth. Occasionally     Cholecalciferol (VITAMIN D3) 50 MCG (2000 UT) TABS 1 tablet     Cyanocobalamin (B-12) 100 MCG TABS Take 1 tablet by mouth. Takes sometimes     cyclobenzaprine  (FLEXERIL ) 10 MG tablet Take 0.5-1 tablets (5-10 mg total) by mouth 2 (two) times daily as needed for muscle spasms. 60 tablet 4   Docusate Sodium (EQ STOOL SOFTENER PO) Take by mouth.     fluticasone  (FLONASE ) 50 MCG/ACT nasal spray Place 2 sprays into both nostrils daily as needed for allergies or rhinitis. 16 g 5   fluticasone  (FLONASE ) 50 MCG/ACT nasal spray Place 2 sprays into both nostrils daily. (Patient taking differently: Place 2 sprays into both nostrils daily. As needed) 16 g 6   gabapentin  (NEURONTIN ) 300 MG capsule Take 1 capsule (300 mg total) by mouth 3 (three) times daily. 90 capsule 5   hydroxypropyl methylcellulose / hypromellose (ISOPTO TEARS / GONIOVISC) 2.5 % ophthalmic solution 1 drop.     Inulin (FIBER CHOICE PO) Take by mouth.     Magnesium 500 MG TABS Takes when remembers to     Sennosides (SENOKOT PO) Take by mouth daily.     valACYclovir  (VALTREX ) 500 MG tablet TAKE 1 TABLET BY MOUTH TWICE A DAY (Patient taking differently: When needed) 30 tablet 2   No facility-administered medications prior to visit.    PAST MEDICAL HISTORY: Past Medical History:  Diagnosis Date   Allergic asthma    Allergy    seasonal/multiple environmental allergies present   Basal cell carcinoma    nose   Cataract    removed both eyes   Degenerative joint disease    in knees, hips,ankles   Diverticulosis    Duodenal ulcer    Fatty liver    per pt   Herpes simplex of female genitalia     History of chicken pox    History of radiation therapy 07/09/18- 08/20/18   Nose, 30 fx of 2 Gy for a total of 60 Gy   Hypertension    Hypoglycemia    Migraine    Seasonal allergies    Tubular adenoma of colon 2016    PAST SURGICAL HISTORY: Past Surgical History:  Procedure Laterality Date   CATARACT EXTRACTION W/ INTRAOCULAR LENS  IMPLANT, BILATERAL  2007/2018   Bil   COLONOSCOPY     COSMETIC SURGERY     Patient reports plastic surgery to neck, eyes, nose in the past.    LIPOSUCTION  2001   POLYPECTOMY     ROOT CANAL  TONSILLECTOMY  1978   TOOTH EXTRACTION      FAMILY HISTORY: Family History  Problem Relation Age of Onset   Breast cancer Mother 46       Deceased   Stroke Mother    Hypertension Mother    Cancer Mother        Breast cancer   Renal cancer Father    Lung cancer Father 63       Deceased   Melanoma Father    Cancer Father    Migraines Sister    Ovarian cancer Sister    Diabetes Brother    Brain cancer Brother        G-Blastoma   Lung cancer Brother    Heart disease Maternal Grandmother    Cancer Paternal Grandfather    Stomach cancer Paternal Grandfather    Cancer - Other Maternal Aunt        Pancreatic   Heart disease Maternal Aunt    Heart attack Maternal Aunt    Lung cancer Maternal Aunt    Cancer Paternal Aunt    Melanoma Paternal Aunt    Multiple myeloma Paternal Aunt    Rectal cancer Cousin    Colon cancer Neg Hx    Esophageal cancer Neg Hx    Colon polyps Neg Hx     SOCIAL HISTORY: Social History   Socioeconomic History   Marital status: Divorced    Spouse name: Not on file   Number of children: 0   Years of education: College   Highest education level: Not on file  Occupational History   Occupation: [employer]    Comment: Counselor/Sales  Tobacco Use   Smoking status: Former    Current packs/day: 0.00    Types: Cigarettes    Quit date: 09/07/1981    Years since quitting: 41.8   Smokeless tobacco: Never    Tobacco comments:    Quit >30 yrs  Vaping Use   Vaping status: Never Used  Substance and Sexual Activity   Alcohol  use: Yes    Comment: 0-2 drinks daily   Drug use: No   Sexual activity: Not Currently  Other Topics Concern   Not on file  Social History Narrative   Lives alone   Caffeine: 1/2 cup the morning, sometimes one during the day   Social Drivers of Corporate investment banker Strain: Not on file  Food Insecurity: Not on file  Transportation Needs: No Transportation Needs (06/22/2018)   PRAPARE - Administrator, Civil Service (Medical): No    Lack of Transportation (Non-Medical): No  Physical Activity: Not on file  Stress: Not on file  Social Connections: Not on file  Intimate Partner Violence: Not At Risk (06/22/2018)   Humiliation, Afraid, Rape, and Kick questionnaire    Fear of Current or Ex-Partner: No    Emotionally Abused: No    Physically Abused: No    Sexually Abused: No      PHYSICAL EXAM  There were no vitals filed for this visit. There is no height or weight on file to calculate BMI.  Generalized: Well developed, in no acute distress   Neurological examination  Mentation: Alert oriented to time, place, history taking. Follows all commands speech and language fluent Cranial nerve II-XII: Pupils were equal round reactive to light. Extraocular movements were full, visual field were full on confrontational test. Facial sensation and strength were normal. Uvula tongue midline. Head turning and shoulder shrug  were normal and symmetric. Motor:  The motor testing reveals 5 over 5 strength of all 4 extremities. Good symmetric motor tone is noted throughout.  Sensory: Sensory testing is intact to soft touch on all 4 extremities. No evidence of extinction is noted.  Coordination: Cerebellar testing reveals good finger-nose-finger and heel-to-shin bilaterally.  Gait and station: Gait is normal. Tandem gait is normal. Romberg is negative. No drift is  seen.  Reflexes: Deep tendon reflexes are symmetric and normal bilaterally.   DIAGNOSTIC DATA (LABS, IMAGING, TESTING) - I reviewed patient records, labs, notes, testing and imaging myself where available.  Lab Results  Component Value Date   WBC 6.1 12/22/2015   HGB 14.3 12/22/2015   HCT 41.6 12/22/2015   MCV 93.7 12/22/2015   PLT 262.0 12/22/2015      Component Value Date/Time   NA 140 12/22/2015 0901   NA 142 09/08/2015 1020   K 5.0 12/22/2015 0901   CL 106 12/22/2015 0901   CO2 29 12/22/2015 0901   GLUCOSE 105 (H) 12/22/2015 0901   BUN 17 11/06/2018 1553   BUN 12 09/08/2015 1020   CREATININE 0.85 11/06/2018 1553   CREATININE 0.82 06/29/2018 1249   CALCIUM 9.7 12/22/2015 0901   PROT 6.5 12/22/2015 0901   PROT 6.7 09/08/2015 1020   ALBUMIN 4.2 12/22/2015 0901   ALBUMIN 4.5 09/08/2015 1020   AST 16 12/22/2015 0901   ALT 22 12/22/2015 0901   ALKPHOS 58 12/22/2015 0901   BILITOT 0.6 12/22/2015 0901   BILITOT 0.6 09/08/2015 1020   GFRNONAA >60 06/29/2018 1249   GFRAA >60 06/29/2018 1249   Lab Results  Component Value Date   CHOL 169 12/22/2015   HDL 66.70 12/22/2015   LDLCALC 81 12/22/2015   TRIG 104.0 12/22/2015   CHOLHDL 3 12/22/2015   Lab Results  Component Value Date   HGBA1C 5.1 12/02/2014   Lab Results  Component Value Date   VITAMINB12 261 09/08/2015   Lab Results  Component Value Date   TSH 0.78 08/24/2015      ASSESSMENT AND PLAN 73 y.o. year old female  has a past medical history of Allergic asthma, Allergy, Basal cell carcinoma, Cataract, Degenerative joint disease, Diverticulosis, Duodenal ulcer, Fatty liver, Herpes simplex of female genitalia, History of chicken pox, History of radiation therapy (07/09/18- 08/20/18), Hypertension, Hypoglycemia, Migraine, Seasonal allergies, and Tubular adenoma of colon (2016). here with:       Clem Currier, MSN, NP-C 06/20/2023, 3:42 PM [facility] [address] [place], [place] [address] [phone]

## 2023-06-21 ENCOUNTER — Telehealth: Payer: Self-pay | Admitting: Neurology

## 2023-06-21 ENCOUNTER — Ambulatory Visit: Admitting: Adult Health

## 2023-06-21 NOTE — Telephone Encounter (Signed)
Referral for Neurosurgery fax to [facility]. Phone: [phone], Fax: [phone]. 

## 2023-07-07 ENCOUNTER — Telehealth: Payer: Self-pay | Admitting: Neurology

## 2023-07-07 DIAGNOSIS — M5481 Occipital neuralgia: Secondary | ICD-10-CM

## 2023-07-07 DIAGNOSIS — M542 Cervicalgia: Secondary | ICD-10-CM

## 2023-07-07 DIAGNOSIS — R519 Headache, unspecified: Secondary | ICD-10-CM

## 2023-07-07 DIAGNOSIS — M7918 Myalgia, other site: Secondary | ICD-10-CM

## 2023-07-07 DIAGNOSIS — M5412 Radiculopathy, cervical region: Secondary | ICD-10-CM

## 2023-07-07 DIAGNOSIS — M4802 Spinal stenosis, cervical region: Secondary | ICD-10-CM

## 2023-07-07 DIAGNOSIS — R200 Anesthesia of skin: Secondary | ICD-10-CM

## 2023-07-07 DIAGNOSIS — H5710 Ocular pain, unspecified eye: Secondary | ICD-10-CM

## 2023-07-07 DIAGNOSIS — R29898 Other symptoms and signs involving the musculoskeletal system: Secondary | ICD-10-CM

## 2023-07-07 NOTE — Telephone Encounter (Signed)
 Pt called back with correct information   [facility] Address: [address] [place], [place], [place] [address] Phone: [phone] [phone]

## 2023-07-07 NOTE — Telephone Encounter (Signed)
 Pt called stating that she has moved to [facility] and want to get a referral for Neurology sent to    [facility]  in [facility]  Pt gave Fax number  [phone]  Tired to call pt back for correct address  , No answer LVM

## 2023-07-10 NOTE — Telephone Encounter (Signed)
 Thank you please refer

## 2023-07-10 NOTE — Addendum Note (Signed)
 Addended by: Burns Carwin on: 07/10/2023 10:44 AM   Modules accepted: Orders

## 2023-07-10 NOTE — Telephone Encounter (Signed)
 Referral for Neurology fax to [facility]. Phone: [phone], Fax: [phone]

## 2023-07-15 ENCOUNTER — Other Ambulatory Visit: Payer: Self-pay | Admitting: Neurology

## 2023-07-20 NOTE — Telephone Encounter (Signed)
 Our office [facility] [address] I called the pt. She saw her new neurologist today and she is very  happy so far. She had an injection in her upper back today and she said she is already 50% better. [facility] She no longer needs it from us . Patient was very appreciative of the care we provided here at [facility] and she said we were awesome and she would recommend us  to anyone.   [facility]  [facility].

## 2023-07-20 NOTE — Telephone Encounter (Signed)
 Spoke with pt. She has a new provider and they prescribed her Gabapentin  to a different pharmacy. [facility] is being canceled.
